# Patient Record
Sex: Female | Born: 1950 | Race: Asian
Health system: Southern US, Academic
[De-identification: ages and names within clinical notes are randomized; demographics above are authoritative.]

## PROBLEM LIST (undated history)

## (undated) ENCOUNTER — Encounter

## (undated) ENCOUNTER — Telehealth

## (undated) ENCOUNTER — Other Ambulatory Visit

## (undated) DIAGNOSIS — E119 Type 2 diabetes mellitus without complications: Secondary | ICD-10-CM

## (undated) DIAGNOSIS — I10 Essential (primary) hypertension: Secondary | ICD-10-CM

---

## 2015-04-24 ENCOUNTER — Ambulatory Visit: Payer: Self-pay | Admitting: Internal Medicine

## 2015-05-14 ENCOUNTER — Encounter: Payer: Self-pay | Admitting: Internal Medicine

## 2015-05-14 ENCOUNTER — Ambulatory Visit: Payer: Self-pay | Attending: Internal Medicine | Admitting: Internal Medicine

## 2015-05-14 VITALS — BP 109/72 | HR 71 | Temp 97.8°F | Resp 17 | Ht 67.0 in | Wt 165.0 lb

## 2015-05-14 DIAGNOSIS — M25671 Stiffness of right ankle, not elsewhere classified: Secondary | ICD-10-CM | POA: Insufficient documentation

## 2015-05-14 DIAGNOSIS — Z0000 Encounter for general adult medical examination without abnormal findings: Secondary | ICD-10-CM | POA: Insufficient documentation

## 2015-05-14 DIAGNOSIS — I10 Essential (primary) hypertension: Secondary | ICD-10-CM | POA: Insufficient documentation

## 2015-05-14 DIAGNOSIS — M25461 Effusion, right knee: Secondary | ICD-10-CM | POA: Insufficient documentation

## 2015-05-14 DIAGNOSIS — IMO0001 Reserved for inherently not codable concepts without codable children: Secondary | ICD-10-CM

## 2015-05-14 DIAGNOSIS — R42 Dizziness and giddiness: Secondary | ICD-10-CM | POA: Insufficient documentation

## 2015-05-14 DIAGNOSIS — M79671 Pain in right foot: Secondary | ICD-10-CM | POA: Insufficient documentation

## 2015-05-14 DIAGNOSIS — R51 Headache: Secondary | ICD-10-CM | POA: Insufficient documentation

## 2015-05-14 DIAGNOSIS — R030 Elevated blood-pressure reading, without diagnosis of hypertension: Secondary | ICD-10-CM | POA: Insufficient documentation

## 2015-05-14 MED ORDER — AMLODIPINE BESYLATE 5 MG PO TABS: 5.0000 mg | ORAL_TABLET | Freq: Every day | ORAL | Status: DC

## 2015-05-14 MED ORDER — CLONIDINE HCL 0.2 MG PO TABS
0.2000 mg | ORAL_TABLET | Freq: Once | ORAL | Status: AC
  Administered 2015-05-14: 0.2 mg via ORAL

## 2015-05-14 NOTE — Progress Notes (Signed)
Patient ID: Debra David, female   DOB: 15-Jun-1950, 64 y.o.   MRN: [id number]  [id number]  [id number]  DOB - 1951-05-30  CC:  Chief Complaint  Patient presents with  . Establish Care       HPI: Debra David is a 64 y.o. female here today to establish medical care. Patient reports that she has been feeling dizzy for several months with blurred vision and headaches. She has never been diagnosed with hypertension. She has not been to a doctor in several years. Denies tobacco, drug, or alcohol use.  Constant right foot pain that is described as feeling like she is walking on rocks. She also reports a growth on the heel of the right foot that is tender to touch that has been present for greater than one year. She denies injury. She suffers from right knee swelling as well.  She complains that for over one year she has had swelling in her left fifth finger and has been unable to bend the finger into a fist. The finger is not painful. She denies any family history of rheumatoid.  No Known Allergies History reviewed. No pertinent past medical history. No current outpatient prescriptions on file prior to visit.   No current facility-administered medications on file prior to visit.   History reviewed. No pertinent family history. Social History   Social History  . Marital Status: Single    Spouse Name: N/A  . Number of Children: N/A  . Years of Education: N/A   Occupational History  . Not on file.   Social History Main Topics  . Smoking status: Never Smoker   . Smokeless tobacco: Not on file  . Alcohol Use: No  . Drug Use: Not on file  . Sexual Activity: Not on file   Other Topics Concern  . Not on file   Social History Narrative  . No narrative on file    Review of Systems  Eyes: Positive for blurred vision.  Cardiovascular: Negative for chest pain and palpitations.  Musculoskeletal: Positive for joint pain. Negative for falls.  Neurological: Positive for dizziness  and headaches.  All other systems reviewed and are negative.    Objective:   Filed Vitals:   05/14/15 0954 05/14/15 0955  BP: 205/111 189/108  Pulse: 91 92  Temp: 97.8 F (36.6 C)   Resp: 17     Physical Exam  Constitutional: She is oriented to person, place, and time.  Neck: No JVD present.  Cardiovascular: Normal rate, regular rhythm and normal heart sounds.   No murmur heard. Pulmonary/Chest: Effort normal and breath sounds normal. She has no wheezes.  Musculoskeletal: She exhibits tenderness (right heel area).  Right knee swelling Left fifth finger swelling, unable to bend finger  Neurological: She is alert and oriented to person, place, and time.  Skin: Skin is warm and dry.  Psychiatric: She has a normal mood and affect.     No results found for: WBC, HGB, HCT, MCV, PLT No results found for: CREATININE, BUN, NA, K, CL, CO2  No results found for: HGBA1C Lipid Panel  No results found for: CHOL, TRIG, HDL, CHOLHDL, VLDL, LDLCALC     Assessment and plan:   Debra David was seen today for establish care.  Diagnoses and all orders for this visit:  Elevated blood pressure -     cloNIDine (CATAPRES) tablet 0.2 mg; Take 1 tablet (0.2 mg total) by mouth once in office.  Pressure improved dramatically before discharge.  Essential hypertension -  amLODipine (NORVASC) 5 MG tablet; Take 1 tablet (5 mg total) by mouth daily. I will begin her on low dose amlodipine and have her to come back in 1 week for a BP check. If she is still >140/90 then I will increase her Amlodipine to 10 mg daily. DASH diet advised  Knee swelling, right May try tylenol arthritis for now. No NSAID's due to severely elevated pressures. I will send referral to Ortho once she obtains financial assistance  Right foot pain See above  Health care maintenance -     Flu Vaccine QUAD 36+ mos PF IM (Fluarix & Fluzone Quad PF)  Return in about 1 week (around 05/21/2015) for Nurse Visit-BP check/, Lab  Visit and 3 mo PCP .    Lance Bosch, [facility] [phone] 05/14/2015, 10:26 AM

## 2015-05-14 NOTE — Progress Notes (Signed)
Patient here to establish care Patient complains of feeling dizzy at times Presents in office with elevated blood pressure 0.2mg  catapress given per office protocol Patient also complains of pain to the back of her right ankle and bottom  Of right foot

## 2015-05-15 ENCOUNTER — Other Ambulatory Visit: Payer: Self-pay

## 2015-05-15 DIAGNOSIS — Z1231 Encounter for screening mammogram for malignant neoplasm of breast: Secondary | ICD-10-CM

## 2015-05-21 ENCOUNTER — Ambulatory Visit: Payer: Self-pay

## 2015-05-22 ENCOUNTER — Ambulatory Visit: Payer: Self-pay | Attending: Internal Medicine

## 2015-06-04 ENCOUNTER — Telehealth: Payer: Self-pay | Admitting: Internal Medicine

## 2015-06-04 ENCOUNTER — Telehealth: Payer: Self-pay

## 2015-06-04 NOTE — Telephone Encounter (Signed)
Pt saying she has been feeling dizzy and is wondering if her BP medication is contributing to the dizziness. Please call pt.

## 2015-06-04 NOTE — Telephone Encounter (Signed)
Returned phone call to house number Person who answered told me i have the wrong number and hung up

## 2015-06-16 ENCOUNTER — Ambulatory Visit: Payer: Self-pay | Admitting: Internal Medicine

## 2015-06-26 ENCOUNTER — Ambulatory Visit: Payer: Self-pay | Attending: Internal Medicine | Admitting: Internal Medicine

## 2015-06-26 ENCOUNTER — Telehealth: Payer: Self-pay | Admitting: Internal Medicine

## 2015-06-26 ENCOUNTER — Encounter: Payer: Self-pay | Admitting: Internal Medicine

## 2015-06-26 VITALS — BP 128/72

## 2015-06-26 DIAGNOSIS — M170 Bilateral primary osteoarthritis of knee: Secondary | ICD-10-CM

## 2015-06-26 DIAGNOSIS — Z79899 Other long term (current) drug therapy: Secondary | ICD-10-CM | POA: Insufficient documentation

## 2015-06-26 DIAGNOSIS — M545 Low back pain, unspecified: Secondary | ICD-10-CM

## 2015-06-26 DIAGNOSIS — I951 Orthostatic hypotension: Secondary | ICD-10-CM

## 2015-06-26 DIAGNOSIS — I10 Essential (primary) hypertension: Secondary | ICD-10-CM

## 2015-06-26 DIAGNOSIS — R51 Headache: Secondary | ICD-10-CM | POA: Insufficient documentation

## 2015-06-26 DIAGNOSIS — M791 Myalgia: Secondary | ICD-10-CM | POA: Insufficient documentation

## 2015-06-26 MED ORDER — MELOXICAM 7.5 MG PO TABS: 7.5000 mg | ORAL_TABLET | Freq: Every day | ORAL | Status: AC

## 2015-06-26 MED FILL — AMLODIPINE BESYLATE 5 MG TA: 5 | 30 days supply | Qty: 30 | Fill #2

## 2015-06-26 MED FILL — MELOXICAM 7.5 MG TABLET: 7.5 | 30 days supply | Qty: 30 | Fill #0
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
## 2015-06-26 NOTE — Patient Instructions (Signed)
Start taking BP medication at bedtime to prevent dizziness. When getting up to change positions please do so slowly to prevent dizziness
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
## 2015-07-03 ENCOUNTER — Telehealth: Payer: Self-pay | Admitting: Internal Medicine
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
## 2015-07-10 ENCOUNTER — Encounter: Payer: Self-pay | Admitting: Internal Medicine

## 2015-07-15 ENCOUNTER — Telehealth: Payer: Self-pay | Admitting: Internal Medicine

## 2015-07-15 ENCOUNTER — Encounter: Payer: Self-pay | Admitting: Internal Medicine

## 2015-07-15 ENCOUNTER — Ambulatory Visit: Payer: Self-pay | Attending: Internal Medicine | Admitting: Internal Medicine

## 2015-07-15 VITALS — BP 161/98 | HR 99 | Temp 98.0°F | Resp 16 | Ht 67.0 in | Wt 157.0 lb

## 2015-07-15 DIAGNOSIS — Z780 Asymptomatic menopausal state: Secondary | ICD-10-CM | POA: Insufficient documentation

## 2015-07-15 DIAGNOSIS — M79671 Pain in right foot: Secondary | ICD-10-CM

## 2015-07-15 DIAGNOSIS — R42 Dizziness and giddiness: Secondary | ICD-10-CM | POA: Insufficient documentation

## 2015-07-15 DIAGNOSIS — Z79899 Other long term (current) drug therapy: Secondary | ICD-10-CM | POA: Insufficient documentation

## 2015-07-15 DIAGNOSIS — Z0000 Encounter for general adult medical examination without abnormal findings: Secondary | ICD-10-CM

## 2015-07-15 DIAGNOSIS — I10 Essential (primary) hypertension: Secondary | ICD-10-CM

## 2015-07-15 DIAGNOSIS — K089 Disorder of teeth and supporting structures, unspecified: Secondary | ICD-10-CM

## 2015-07-15 LAB — CBC WITH DIFFERENTIAL/PLATELET
BASOS PCT: 0 % (ref 0–1)
Basophils Absolute: 0 10*3/uL (ref 0.0–0.1)
EOS ABS: 0.1 10*3/uL (ref 0.0–0.7)
Eosinophils Relative: 1 % (ref 0–5)
HCT: 44 % (ref 36.0–46.0)
Hemoglobin: 13.9 g/dL (ref 12.0–15.0)
Lymphocytes Relative: 45 % (ref 12–46)
Lymphs Abs: 3.3 10*3/uL (ref 0.7–4.0)
MCH: 29.4 pg (ref 26.0–34.0)
MCHC: 31.6 g/dL (ref 30.0–36.0)
MCV: 93 fL (ref 78.0–100.0)
MONOS PCT: 8 % (ref 3–12)
MPV: 10.2 fL (ref 8.6–12.4)
Monocytes Absolute: 0.6 10*3/uL (ref 0.1–1.0)
NEUTROS PCT: 46 % (ref 43–77)
Neutro Abs: 3.4 10*3/uL (ref 1.7–7.7)
PLATELETS: 445 10*3/uL — AB (ref 150–400)
RBC: 4.73 MIL/uL (ref 3.87–5.11)
RDW: 13.8 % (ref 11.5–15.5)
WBC: 7.3 10*3/uL (ref 4.0–10.5)

## 2015-07-15 LAB — TSH: TSH: 1.28 m[IU]/L

## 2015-07-15 MED ORDER — LOSARTAN POTASSIUM 50 MG PO TABS: 50.0000 mg | ORAL_TABLET | Freq: Every day | ORAL | Status: DC

## 2015-07-15 MED ORDER — NYSTATIN 100000 UNIT/GM EX CREA: 1.0000 "application " | TOPICAL_CREAM | Freq: Two times a day (BID) | CUTANEOUS | Status: AC

## 2015-07-15 MED FILL — NYSTATIN 100,000 UNIT/GM CR: 100000 | 14 days supply | Qty: 30 | Fill #0

## 2015-07-15 MED FILL — LOSARTAN POTASSIUM 50 MG TA: 50 | 30 days supply | Qty: 30 | Fill #0

## 2015-07-15 NOTE — Progress Notes (Signed)
Patient ID: Debra David, female   DOB: 1951-03-11, 65 y.o.   MRN: [id number]  CC: physical  HPI: Debra David is a 65 y.o. female here today for a annual physical.  Patient has past medical history of hypertension. Patient is not up to date  On her pap or mammogram. She is only on Amlodipine and has been controlled. Patient is postmenopausal and is not a smoker. She denies alcohol or drug use. She is unemployed. She states that she would like to see a Pharmacist, community and Optometrist as part of her health maintenance. She denies vaginal discharge, itch, odor, or lesions. No new sexual partners. She would like STD testing.  Patient still complains of mild dizziness throughout the day although she has switched to taking her Amlodipine at bedtime. She is requesting a change in medication today.   No Known Allergies History reviewed. No pertinent past medical history. Current Outpatient Prescriptions on File Prior to Visit  Medication Sig Dispense Refill  . amLODipine (NORVASC) 5 MG tablet Take 1 tablet (5 mg total) by mouth daily. 90 tablet 3  . meloxicam (MOBIC) 7.5 MG tablet Take 1 tablet (7.5 mg total) by mouth daily. For knee/back pain 30 tablet 1   No current facility-administered medications on file prior to visit.   Family History  Problem Relation Age of Onset  . Cancer Father     lung  . Cancer Maternal Grandmother     breast  . Diabetes Maternal Grandmother   . Cancer Mother     breast cancer   Social History   Social History  . Marital Status: Single    Spouse Name: N/A  . Number of Children: N/A  . Years of Education: N/A   Occupational History  . Not on file.   Social History Main Topics  . Smoking status: Never Smoker   . Smokeless tobacco: Not on file  . Alcohol Use: No  . Drug Use: Not on file  . Sexual Activity: Not on file   Other Topics Concern  . Not on file   Social History Narrative    Review of Systems  Constitutional:       Dry skin  Musculoskeletal:        Right heel pain   Skin: Positive for itching.       Varicose veins-achy legs  Neurological: Positive for dizziness and headaches.  All other systems reviewed and are negative.   Objective:   Filed Vitals:   07/15/15 1444  BP: 161/98  Pulse: 99  Temp: 98 F (36.7 C)  Resp: 16    Physical Exam: Constitutional: Patient appears well-developed and well-nourished. No distress. HENT: Normocephalic, atraumatic, External right and left ear normal. Oropharynx is clear and moist.  Eyes: Conjunctivae and EOM are normal. PERRLA, no scleral icterus. Neck: Normal ROM. Neck supple. No JVD. No tracheal deviation. No thyromegaly. CVS: RRR, S1/S2 +, no murmurs, no gallops, no carotid bruit.  Pulmonary: Effort and breath sounds normal, no stridor, rhonchi, wheezes, rales.  Abdominal: Soft. BS +,  no distension, tenderness, rebound or guarding.  Musculoskeletal: Normal range of motion. No edema and no tenderness.  Lymphadenopathy: No lymphadenopathy noted, cervical, inguinal or axillary Neuro: Alert. Normal reflexes, muscle tone coordination. No cranial nerve deficit. Skin: Skin is warm and dry. No rash noted. Not diaphoretic. No erythema. No pallor. Psychiatric: Normal mood and affect. Behavior, judgment, thought content normal. Physical Exam  Pulmonary/Chest: Right breast exhibits no mass and no nipple discharge. Left breast exhibits no  mass and no nipple discharge.  Left nipple-skin growth  Genitourinary: Uterus normal. No breast tenderness. There is rash on the right labia. There is rash on the left labia. Cervix exhibits no motion tenderness, no discharge and no friability. Right adnexum displays no tenderness. Left adnexum displays no tenderness. Vaginal discharge found.  Lymphadenopathy:       Right: No inguinal adenopathy present.       Left: No inguinal adenopathy present.       No results found for: WBC, HGB, HCT, MCV, PLT No results found for: CREATININE, BUN, NA, K, CL,  CO2  No results found for: HGBA1C Lipid Panel  No results found for: CHOL, TRIG, HDL, CHOLHDL, VLDL, LDLCALC     Assessment and plan:   [name] was seen today for annual exam.  Diagnoses and all orders for this visit:  Annual physical exam -     Cytology - PAP [place] -     HIV antibody (with reflex) -     RPR -     Hepatitis panel, acute -     CBC with Differential -     TSH  Poor dentition -     Ambulatory referral to Dentistry  Heel pain, right -     Ambulatory referral to Podiatry  Essential hypertension -     losartan (COZAAR) 50 MG tablet; Take 1 tablet (50 mg total) by mouth daily. -     COMPLETE METABOLIC PANEL WITH GFR -     Lipid panel I have discontinued Amlodipine and switched her to Losartan. Hopefully this will help with dizziness. She will need to return in 2 weeks for a BP check with Stacy.  Return in about 2 weeks (around 07/29/2015) for Nurse Visit-BP check and 3 mo PCP HTN.       Lance Bosch, [facility] [phone] 07/15/2015, 2:57 PM

## 2015-07-15 NOTE — Telephone Encounter (Signed)
Patient picked up [place].

## 2015-07-15 NOTE — Progress Notes (Signed)
Patient here for her annual check up Patient presents with elevated pressure States she has been taking her medication

## 2015-07-15 NOTE — Addendum Note (Signed)
Addended by: Dorothe Pea on: 07/15/2015 04:38 PM   Modules accepted: Orders

## 2015-07-16 LAB — LIPID PANEL
Cholesterol: 291 mg/dL — ABNORMAL HIGH (ref 125–200)
HDL: 58 mg/dL (ref >=46)
LDL Cholesterol: 170 mg/dL — ABNORMAL HIGH (ref <130)
Total CHOL/HDL Ratio: 5 Ratio (ref <=5.0)
Triglycerides: 317 mg/dL — ABNORMAL HIGH (ref <150)
VLDL: 63 mg/dL — ABNORMAL HIGH (ref <30)

## 2015-07-16 LAB — COMPLETE METABOLIC PANEL WITH GFR
ALBUMIN: 4.4 g/dL (ref 3.6–5.1)
ALK PHOS: 116 U/L (ref 33–130)
ALT: 23 U/L (ref 6–29)
AST: 26 U/L (ref 10–35)
BILIRUBIN TOTAL: 0.5 mg/dL (ref 0.2–1.2)
BUN: 6 mg/dL — ABNORMAL LOW (ref 7–25)
CO2: 28 mmol/L (ref 20–31)
Calcium: 9.7 mg/dL (ref 8.6–10.4)
Chloride: 103 mmol/L (ref 98–110)
Creat: 0.83 mg/dL (ref 0.50–0.99)
GFR, EST AFRICAN AMERICAN: 87 mL/min (ref >=60)
GFR, EST NON AFRICAN AMERICAN: 76 mL/min (ref >=60)
GLUCOSE: 356 mg/dL — AB (ref 65–99)
Potassium: 5.6 mmol/L — ABNORMAL HIGH (ref 3.5–5.3)
Sodium: 141 mmol/L (ref 135–146)
TOTAL PROTEIN: 7.4 g/dL (ref 6.1–8.1)

## 2015-07-16 LAB — HEPATITIS PANEL, ACUTE
HCV Ab: NEGATIVE
HEP B S AG: NEGATIVE
Hep A IgM: NONREACTIVE
Hep B C IgM: NONREACTIVE

## 2015-07-16 LAB — HIV ANTIBODY (ROUTINE TESTING W REFLEX): HIV 1&2 Ab, 4th Generation: NONREACTIVE

## 2015-07-16 LAB — CERVICOVAGINAL ANCILLARY ONLY
CHLAMYDIA, DNA PROBE: NEGATIVE
NEISSERIA GONORRHEA: NEGATIVE

## 2015-07-16 LAB — RPR

## 2015-07-17 ENCOUNTER — Telehealth: Payer: Self-pay | Admitting: Internal Medicine

## 2015-07-17 ENCOUNTER — Telehealth: Payer: Self-pay

## 2015-07-17 LAB — CYTOLOGY - PAP

## 2015-07-17 LAB — CERVICOVAGINAL ANCILLARY ONLY: Wet Prep (BD Affirm): POSITIVE — AB

## 2015-07-17 NOTE — Telephone Encounter (Signed)
Patient states she has lost her mammogram  order form, please f/u

## 2015-07-17 NOTE — Telephone Encounter (Signed)
Returned patient phone call Patient had lost the information for her mammogram Information left at front desk

## 2015-07-21 ENCOUNTER — Telehealth: Payer: Self-pay

## 2015-07-21 ENCOUNTER — Other Ambulatory Visit: Payer: Self-pay | Admitting: Internal Medicine

## 2015-07-21 DIAGNOSIS — B9689 Other specified bacterial agents as the cause of diseases classified elsewhere: Secondary | ICD-10-CM

## 2015-07-21 DIAGNOSIS — E7800 Pure hypercholesterolemia, unspecified: Secondary | ICD-10-CM

## 2015-07-21 DIAGNOSIS — E875 Hyperkalemia: Secondary | ICD-10-CM

## 2015-07-21 DIAGNOSIS — N760 Acute vaginitis: Secondary | ICD-10-CM

## 2015-07-21 MED ORDER — FUROSEMIDE 20 MG PO TABS
20.0000 mg | ORAL_TABLET | Freq: Every day | ORAL | Status: AC
Start: 2015-07-21 — End: ?

## 2015-07-21 MED ORDER — ATORVASTATIN CALCIUM 20 MG PO TABS: 20.0000 mg | ORAL_TABLET | Freq: Every day | ORAL | Status: AC

## 2015-07-21 MED ORDER — METRONIDAZOLE 500 MG PO TABS: 500.0000 mg | ORAL_TABLET | Freq: Two times a day (BID) | ORAL | Status: AC

## 2015-07-21 MED FILL — ATORVASTATIN 20 MG TABLET: 20 | 30 days supply | Qty: 30 | Fill #0

## 2015-07-21 MED FILL — ?FUROSEMIDE 20 MG TABLET: 20 | 10 days supply | Qty: 10 | Fill #0

## 2015-07-21 MED FILL — metroNIDAZOLE 500 MG TABS: 500 | 7 days supply | Qty: 14 | Fill #0

## 2015-07-21 NOTE — Telephone Encounter (Signed)
-----   Message from Lance Bosch, NP sent at 07/21/2015  9:07 AM EST ----- Patient positive for Bacterial Vaginosis . Please explain this is not a STD, but a imbalance of the vaginal pH. Please send Flagyl 500 mg BID for 7 days. No refills, no alcohol while on this medication.  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 Cholesterol is really elevated. Please go over things that increase cholesterol levels such as breads pasta, rice, butters, fried foods, etc. Please send Lipitor 20 mg to take every evening with dinner. Please explain that high cholesterol places her at risk for stroke and heart disease Potassium is slightly elevated I will send her few days of Lasix which will make her urinate out potassium. Please explain importance of normal potassium level

## 2015-07-21 NOTE — Telephone Encounter (Signed)
Spoke with patient and she is aware of her lab results Medications for flagyl and [facility] Patient is scheduled for the colpol clinic for March 2nd at [facility]

## 2015-07-23 ENCOUNTER — Other Ambulatory Visit: Payer: Self-pay

## 2015-07-23 ENCOUNTER — Other Ambulatory Visit: Payer: Self-pay | Admitting: Internal Medicine

## 2015-07-23 DIAGNOSIS — Z1231 Encounter for screening mammogram for malignant neoplasm of breast: Secondary | ICD-10-CM

## 2015-07-30 ENCOUNTER — Ambulatory Visit: Payer: Self-pay | Attending: Internal Medicine | Admitting: Pharmacist

## 2015-07-30 ENCOUNTER — Encounter: Payer: Self-pay | Admitting: Pharmacist

## 2015-07-30 VITALS — BP 136/88 | HR 92

## 2015-07-30 DIAGNOSIS — I10 Essential (primary) hypertension: Secondary | ICD-10-CM

## 2015-07-30 NOTE — Patient Instructions (Addendum)
Thanks for coming to see me!  Your blood pressure looks much better today. Continue taking the losartan.  Follow up with Mateo Flow on the dizziness

## 2015-07-30 NOTE — Progress Notes (Signed)
S:    Patient arrives in good spirits.  Presents to the clinic for hypertension evaluation. Patient was referred on 07/15/15 by Chari Manning.  Patient was last seen by Primary Care Provider on 07/15/15.   Patient reports adherence with medications.  Current BP Medications include:  Losartan 50 mg daily.  Dietary habits include: patient eats salty foods, such as chips and crackers, as well as some fast food.  Patient denies picking up the furosemide from the pharmacy - she reports that she did not know that it was there.   Patient reports continued dizziness and thinks that it may be related to a migraine. She reports that she has been told in the past that she has migraines. She reports dizziness when standing but not while sitting. She takes care to get up slowly. The dizziness comes and goes and does not occur every time she stands or walks.    O:   Last 3 Office BP readings: BP Readings from Last 3 Encounters:  07/30/15 136/88  07/15/15 161/98  06/26/15 128/72    BMET    Component Value Date/Time   NA 141 07/15/2015 1536   K 5.6* 07/15/2015 1536   CL 103 07/15/2015 1536   CO2 28 07/15/2015 1536   GLUCOSE 356* 07/15/2015 1536   BUN 6* 07/15/2015 1536   CREATININE 0.83 07/15/2015 1536   CALCIUM 9.7 07/15/2015 1536   GFRNONAA 76 07/15/2015 1536   GFRAA 87 07/15/2015 1536    A/P: Hypertension currently controlled on current medications.  Continued losartan 50 mg daily. Instructed patient to pick up furosemide and to take it to get her potassium down. Reinforced the dangers of elevated potassium and the importance of taking furosemide. Patient verbalized understanding. I am not sure if the dizziness is orthostatic hypotension or related to a migraine. Her blood pressure is controlled now and she refuses to try another medication since this one worked so well for her. Reviewed DASH diet with patient. Patient to follow up with Mateo Flow for continued dizziness and  hyperkalemia.  Results reviewed and written information provided.   Total time in face-to-face counseling 30 minutes.   F/U Clinic Visit with Chari Manning. Patient seen with Dr. Janne Napoleon.

## 2015-08-01 ENCOUNTER — Ambulatory Visit
Admission: RE | Admit: 2015-08-01 | Discharge: 2015-08-01 | Disposition: A | Discharge status: Home / Self Care | Payer: No Typology Code available for payment source | Source: Ambulatory Visit | Attending: Internal Medicine | Admitting: Internal Medicine

## 2015-08-01 DIAGNOSIS — Z1231 Encounter for screening mammogram for malignant neoplasm of breast: Secondary | ICD-10-CM

## 2015-08-05 ENCOUNTER — Other Ambulatory Visit: Payer: Self-pay | Admitting: Internal Medicine

## 2015-08-05 DIAGNOSIS — R928 Other abnormal and inconclusive findings on diagnostic imaging of breast: Secondary | ICD-10-CM

## 2015-08-06 ENCOUNTER — Ambulatory Visit: Payer: Self-pay | Attending: Internal Medicine | Admitting: Internal Medicine

## 2015-08-06 ENCOUNTER — Encounter: Payer: Self-pay | Admitting: Internal Medicine

## 2015-08-06 VITALS — BP 123/85 | HR 98 | Temp 98.2°F | Resp 18 | Ht 67.0 in | Wt 153.2 lb

## 2015-08-06 DIAGNOSIS — R42 Dizziness and giddiness: Secondary | ICD-10-CM

## 2015-08-06 DIAGNOSIS — S66902D Unspecified injury of unspecified muscle, fascia and tendon at wrist and hand level, left hand, subsequent encounter: Secondary | ICD-10-CM

## 2015-08-06 DIAGNOSIS — M170 Bilateral primary osteoarthritis of knee: Secondary | ICD-10-CM

## 2015-08-06 DIAGNOSIS — S66802D Unspecified injury of other specified muscles, fascia and tendons at wrist and hand level, left hand, subsequent encounter: Secondary | ICD-10-CM

## 2015-08-06 MED FILL — LOSARTAN POTASSIUM 50 MG TA: 50 | 30 days supply | Qty: 30 | Fill #1

## 2015-08-06 NOTE — Progress Notes (Signed)
   Subjective:    Patient ID: Debra David, female    DOB: 1951-04-24, 65 y.o.   MRN: [id number]  HPI Comments: Patient reported dizziness after being started on Amlodipine. She was told to take medication at bedtime to prevent dizziness. She continued to report dizziness so she was later switched to Losartan. She now reports that she is still having dizziness and headaches.  Patient reports that she now has [employer] discount and would like her referral to Orthopedics for knee osteoarthritis and left finger that she has been unable to bend for over one year.  Dizziness This is a recurrent problem. The current episode started more than 1 month ago. The problem occurs daily. The problem has been unchanged. Associated symptoms include arthralgias, headaches and joint swelling. Pertinent negatives include no abdominal pain, fatigue, nausea or vertigo. The symptoms are aggravated by standing (changing positions quickly). She has tried nothing for the symptoms.    Review of Systems  Constitutional: Negative for fatigue.  Gastrointestinal: Negative for nausea and abdominal pain.  Musculoskeletal: Positive for joint swelling and arthralgias.  Neurological: Positive for dizziness and headaches. Negative for vertigo.  All other systems reviewed and are negative.      Objective:   Physical Exam  Constitutional: She is oriented to person, place, and time.  Cardiovascular: Normal rate and normal heart sounds.   Pulmonary/Chest: Effort normal and breath sounds normal.  Musculoskeletal: She exhibits tenderness.  Unable to bend left 5th digit Severe crepitus of right knee with ROM  Neurological: She is alert and oriented to person, place, and time. No cranial nerve deficit. Coordination normal.  Skin: Skin is warm and dry.  Psychiatric: She has a normal mood and affect.      Assessment & Plan:  Treanna was seen today for dizziness.  Diagnoses and all orders for this visit:  Dizziness Continue to  take BP medication at bedtime. Orthostatic vitals are unrevealing. I believe patient is having some positional vertigo. I have stressed that she change positions slowly and stay hydrated. She reports only drinking 2 bottles of water per day.   Injury of flexor tendon of hand, left, subsequent encounter -     Ambulatory referral to Orthopedic Surgery  Osteoarthritis of both knees, unspecified osteoarthritis type -     Ambulatory referral to Orthopedic Surgery   Return in about 3 months (around 11/06/2015) for Hypertension.  Lance Bosch, NP 08/07/2015 9:21 AM

## 2015-08-06 NOTE — Progress Notes (Signed)
Patient here for dizziness that has been occuring a while now. Patient reports when she first experienced it her medication was changed but she is still experiencing it. Patient reports headaches that occur every day that have been present for a long time.

## 2015-08-06 NOTE — Patient Instructions (Signed)

## 2015-08-07 ENCOUNTER — Ambulatory Visit (INDEPENDENT_AMBULATORY_CARE_PROVIDER_SITE_OTHER): Payer: Self-pay | Admitting: Family Medicine

## 2015-08-07 VITALS — BP 147/89 | HR 101 | Temp 98.6°F | Ht 67.0 in | Wt 158.4 lb

## 2015-08-07 DIAGNOSIS — R87810 Cervical high risk human papillomavirus (HPV) DNA test positive: Secondary | ICD-10-CM | POA: Insufficient documentation

## 2015-08-07 NOTE — Patient Instructions (Signed)

## 2015-08-07 NOTE — Progress Notes (Signed)
Patient ID: Debra David, female   DOB: 1950-06-23, 65 y.o.   MRN: [id number]  Chief Complaint  Patient presents with  . Colposcopy    HPI Debra David is a 65 y.o. female.  Denies any current complaints. Post-menopausal.  HPI  Indications: Pap smear on February 2017 showed: normal cytology with high risk HPV positive. Previous colposcopy: none. Prior cervical treatment: no treatment.  No past medical history on file.  No past surgical history on file.  Family History  Problem Relation Age of Onset  . Cancer Father     lung  . Cancer Maternal Grandmother     breast  . Diabetes Maternal Grandmother   . Cancer Mother     breast cancer    Social History Social History  Substance Use Topics  . Smoking status: Never Smoker   . Smokeless tobacco: None  . Alcohol Use: No    No Known Allergies  Current Outpatient Prescriptions  Medication Sig Dispense Refill  . atorvastatin (LIPITOR) 20 MG tablet Take 1 tablet (20 mg total) by mouth daily. 90 tablet 3  . furosemide (LASIX) 20 MG tablet Take 1 tablet (20 mg total) by mouth daily. 10 tablet 0  . losartan (COZAAR) 50 MG tablet Take 1 tablet (50 mg total) by mouth daily. 30 tablet 3  . meloxicam (MOBIC) 7.5 MG tablet Take 1 tablet (7.5 mg total) by mouth daily. For knee/back pain (Patient not taking: Reported on 08/06/2015) 30 tablet 1  . metroNIDAZOLE (FLAGYL) 500 MG tablet Take 1 tablet (500 mg total) by mouth 2 (two) times daily. (Patient not taking: Reported on 08/06/2015) 14 tablet 0  . nystatin cream (MYCOSTATIN) Apply 1 application topically 2 (two) times daily. Apply to outside of the vaginal area (Patient not taking: Reported on 08/06/2015) 30 g 0   No current facility-administered medications for this visit.    Review of Systems Review of Systems - no complaints  Blood pressure 147/89, pulse 101, temperature 98.6 F (37 C), temperature source Oral, height 5\' 7"  (1.702 m), weight 158 lb 6.4 oz (71.85 kg).  Physical  Exam Physical Exam  Genitourinary: Vagina normal. There is no rash, lesion or injury on the right labia. There is no rash, lesion or injury on the left labia. Cervix exhibits no friability. Discharge: mild white thin discharge present.  No cervical lesions or areas of increased uptake of solution    Data Reviewed Pap smear reviewed  Assessment    Procedure Details  The risks and benefits of the procedure and Written informed consent obtained.  Speculum placed in vagina and excellent visualization of cervix achieved, cervix swabbed x 3 with acetic acid solution and then with iodine solution. Cervix was visualized through colposcope.  Uniform uptake of acetic acid and iodine over cervix.  No areas of abnormality.  No specimens collected.  Specimens: none  Complications: none.     Plan   Normal colposcopy, no specimens collected Repeat pap smear in 1 year       Virginia Crews, MD, MPH PGY-2,  [facility] 08/07/2015 11:04 AM

## 2015-08-13 ENCOUNTER — Encounter: Payer: Self-pay | Admitting: Podiatry

## 2015-08-13 ENCOUNTER — Ambulatory Visit: Payer: No Typology Code available for payment source

## 2015-08-13 ENCOUNTER — Ambulatory Visit: Payer: No Typology Code available for payment source | Admitting: Podiatry

## 2015-08-13 VITALS — BP 159/86 | HR 82 | Resp 12

## 2015-08-13 DIAGNOSIS — S86011A Strain of right Achilles tendon, initial encounter: Secondary | ICD-10-CM

## 2015-08-13 DIAGNOSIS — D3613 Benign neoplasm of peripheral nerves and autonomic nervous system of lower limb, including hip: Secondary | ICD-10-CM

## 2015-08-13 DIAGNOSIS — R52 Pain, unspecified: Secondary | ICD-10-CM

## 2015-08-13 DIAGNOSIS — G5761 Lesion of plantar nerve, right lower limb: Secondary | ICD-10-CM

## 2015-08-13 NOTE — Progress Notes (Signed)
   Subjective:    Patient ID: Debra David, female    DOB: July 25, 1950, 65 y.o.   MRN: [id number]  HPI   This patient presents today describing intermittent sharp shooting pains in around her ball area of her right and left feet, with the right foot more symptomatic than the left she describes sharp shooting-like pains and a feeling as if rocks are in the ball area of the feet that occur on and off weightbearing for the past 2 years increasing with frequency in the past 6 months. She has changed her shoes and reduced amount of standing walking to accommodate disease uncomfortable sensations.  She is also complaining of pain in the tendo Achilles area for the past 2 years with gradual increasing pain and swelling in the right tendo Achilles. Patient denies any direct injury to the right tendo Achilles. In the past patient has had weightbearing occupation working in [employer], however, is currently retired.  Review of Systems  Musculoskeletal: Positive for joint swelling.       Objective:   Physical Exam  Orientated 3  Vascular: No peripheral edema bilaterally DP and PT pulses 2/4 bilaterally Capillary reflex immediate bilaterally  Neurological: Sensation to 10 g monofilament wire intact 4/5 right 5/5 left Vibratory sensation equal and reactive bilaterally Ankle reflex equal and reactive bilaterally  Dermatological: Texture and turgor within normal limits No open skin lesions bilaterally   Musculoskeletal: Patient has limping gait favoring the right foot Palpable thickening tendo Achilles right which is tender direct palpation and duplicates pain and tendo Achilles area right There is no pain or crepitus on range of motion of ankle, subtalar, midtarsal, metatarsophalangeal joints bilaterally Palpable tenderness in the second, third, fourth intermetatarsal spaces right greater than left without any palpable lesions.  X-ray examination weightbearing right foot dated  08/13/2015  Intact bony structure without fracture and/or dislocation Bunion Hammertoe second Pes planus Bone density joint spaces adequate  Radiographic impression: No acute bony abnormality noted in the right foot dated 08/13/2015  X-ray examination weightbearing left foot dated 08/13/2015  Intact bony structures without fracture and/or dislocation HAV Hammertoe second Pes planus Bone density and joint spaces adequate  Radiographic impression: No acute bony abnormality noted left foot dated 08/13/2015    Assessment & Plan:   Assessment: Satisfactory neurovascular status Chronic partial tear/Achilles tendinitis right Intermetatarsal neuroma, 2, 3, 4 bilateral right greater than left versus intermetatarsal neuritis 2, 3, 4 bilateral  Plan: Today I reviewed the results with the patient's examination and x-ray. I'm recommending local dexamethasone injections into the second, third, fourth right intermetatarsal spaces on the right foot which is the most symptomatic. Patient verbally consents Also, I am recommending Cam Walker boot to wear on right lower leg and treatment of Achilles tendinitis partial tear  Skin is prepped with alcohol and Betadine and in divided doses 4 mg of dexamethasone phosphate mixed with 10 mg of plain Marcaine were injected into the second, third, fourth right intermetatarsal spaces. Patient tolerated these 3 injections without any difficulty  Cam Walker boot dispensed to wear and right lower extremity when standing and walking  Reevaluate 4 weeks

## 2015-08-13 NOTE — Patient Instructions (Signed)
Today your examination revealed a swollen right heel cord consistent most likely with a partial tear for the past 2 years And placing you on a Cam Walker boot which is a removable cast and wear during the day when standing walking remove at night when showering sleeping  Also, I injected medicine imto theright foot to see if the pain in the right ball area will reduce as result of these injections

## 2015-08-14 DIAGNOSIS — D3613 Benign neoplasm of peripheral nerves and autonomic nervous system of lower limb, including hip: Secondary | ICD-10-CM

## 2015-08-14 MED ORDER — DEXAMETHASONE SODIUM PHOSPHATE 120 MG/30ML IJ SOLN
4.0000 mg | Freq: Once | INTRAMUSCULAR | Status: AC
  Administered 2015-08-14: 4 mg via INTRA_ARTICULAR

## 2015-08-19 ENCOUNTER — Other Ambulatory Visit (HOSPITAL_COMMUNITY): Payer: Self-pay | Admitting: *Deleted

## 2015-08-19 DIAGNOSIS — R928 Other abnormal and inconclusive findings on diagnostic imaging of breast: Secondary | ICD-10-CM

## 2015-08-22 ENCOUNTER — Ambulatory Visit
Admission: RE | Admit: 2015-08-22 | Discharge: 2015-08-22 | Disposition: A | Discharge status: Home / Self Care | Payer: No Typology Code available for payment source | Source: Ambulatory Visit | Attending: Obstetrics and Gynecology | Admitting: Obstetrics and Gynecology

## 2015-08-22 ENCOUNTER — Encounter (HOSPITAL_COMMUNITY): Payer: Self-pay

## 2015-08-22 ENCOUNTER — Ambulatory Visit (HOSPITAL_COMMUNITY)
Admission: RE | Admit: 2015-08-22 | Discharge: 2015-08-22 | Disposition: A | Discharge status: Home / Self Care | Payer: No Typology Code available for payment source | Source: Ambulatory Visit | Attending: Obstetrics and Gynecology | Admitting: Obstetrics and Gynecology

## 2015-08-22 VITALS — BP 120/76 | Temp 97.6°F | Ht 67.0 in | Wt 154.0 lb

## 2015-08-22 DIAGNOSIS — Z1239 Encounter for other screening for malignant neoplasm of breast: Secondary | ICD-10-CM

## 2015-08-22 DIAGNOSIS — R928 Other abnormal and inconclusive findings on diagnostic imaging of breast: Secondary | ICD-10-CM

## 2015-08-22 HISTORY — DX: Essential (primary) hypertension: I10

## 2015-08-22 MED FILL — ATORVASTATIN 20 MG TABLET: 20 | 30 days supply | Qty: 30 | Fill #1

## 2015-08-22 NOTE — Progress Notes (Signed)
Complaints of bilateral breast pain that comes and goes. Patient rated pain at 5 out of 10.  Pap Smear:  Pap smear not completed today. Last Pap smear was 07/15/2015 at [facility] that was normal with positive HPV. A colposcopy was completed for follow up 08/07/2015 that was normal. Last Pap smear and colposcopy result are in EPIC.  Physical exam: Breasts Breasts symmetrical. No skin abnormalities right breast. Growth observed on left nipple that per patient has been there since birth. No nipple retraction bilateral breasts. No nipple discharge bilateral breasts. No lymphadenopathy. No lumps palpated bilateral breasts. No complaints of pain or tenderness on exam. Referred patient to the [facility] for bilateral diagnostic mammogram per recommendation. Appointment scheduled for Friday, August 22, 2015 at 1250.   Pelvic/Bimanual No Pap smear completed today since last Pap smear was 07/15/2015. Pap smear not indicated per BCCCP guidelines.   Smoking History: Patient has never smoked.  Patient Navigation: Patient education provided. Access to services provided for patient through [facility] program.   Colorectal Cancer Screening: Patient has never had a colonoscopy. No complaints today.

## 2015-08-22 NOTE — Patient Instructions (Signed)
Educational materials on self breast awareness given. Explained to Debra David that she did not need a Pap smear today due to last Pap smear and HPV typing was 07/15/2015. Let her know BCCCP will cover Pap smears and HPV typing every 5 years unless has a history of abnormal Pap smears. Referred patient to the [facility] for bilateral diagnostic mammogram per recommendation. Appointment scheduled for Friday, August 22, 2015 at 1250. Patient aware of appointment and will be there. [name] verbalized understanding.  Tedrick Port, Arvil Chaco, RN 1:36 PM

## 2015-08-25 ENCOUNTER — Telehealth: Payer: Self-pay | Admitting: *Deleted

## 2015-08-25 NOTE — Telephone Encounter (Signed)
Pt states the boot Dr. Amalia Hailey put her in is too tight and hurts she would like to make an appt to have the boot refitted.  Transferred to schedulers to be put on the nurses schedule.

## 2015-08-27 ENCOUNTER — Telehealth: Payer: Self-pay

## 2015-08-27 ENCOUNTER — Telehealth: Payer: Self-pay | Admitting: Internal Medicine

## 2015-08-27 ENCOUNTER — Other Ambulatory Visit: Payer: No Typology Code available for payment source

## 2015-08-27 DIAGNOSIS — Z0000 Encounter for general adult medical examination without abnormal findings: Secondary | ICD-10-CM

## 2015-08-27 NOTE — Telephone Encounter (Signed)
Returned phone call to patient Patient is requesting a referral to the eye dr. Referral placed in epic

## 2015-08-27 NOTE — Telephone Encounter (Signed)
Pt. Came in today requesting a referral to see the Optometrists. Please f/u with pt.

## 2015-08-29 ENCOUNTER — Encounter: Payer: Self-pay | Admitting: Family Medicine

## 2015-08-29 ENCOUNTER — Ambulatory Visit (INDEPENDENT_AMBULATORY_CARE_PROVIDER_SITE_OTHER): Payer: Self-pay | Admitting: Family Medicine

## 2015-08-29 VITALS — BP 146/83 | Ht 67.0 in | Wt 154.0 lb

## 2015-08-29 DIAGNOSIS — M19042 Primary osteoarthritis, left hand: Secondary | ICD-10-CM

## 2015-08-29 DIAGNOSIS — M79642 Pain in left hand: Secondary | ICD-10-CM

## 2015-08-29 DIAGNOSIS — M6530 Trigger finger, unspecified finger: Secondary | ICD-10-CM

## 2015-08-29 MED ORDER — METHYLPREDNISOLONE ACETATE 40 MG/ML IJ SUSP
40.0000 mg | Freq: Once | INTRAMUSCULAR | Status: AC
  Administered 2015-08-29: 40 mg via INTRA_ARTICULAR

## 2015-08-29 NOTE — Assessment & Plan Note (Signed)
Corticosteroid injection into MCP joint. Follow-up when necessary.

## 2015-08-29 NOTE — Assessment & Plan Note (Signed)
Corticosteroid injection today into the A1 pulley area. She's not having some improvement and range of motion in the next 2-3 weeks at lights see her back in clinic. Ultrasound did show an area of potential stricture on the flexor tendon and quite a bit of synovial thickening at the A1 pulley.

## 2015-08-29 NOTE — Progress Notes (Signed)
Patient ID: Debra David, female   DOB: [birth year], 65 y.o.   MRN: [id number]  Debra David - 65 y.o. female MRN [id number]  Date of birth: [birth year]    SUBJECTIVE:     Chief Complaint: Left pinky finger pain and difficulty with flexion, also having pain in left index finger knuckle.  HPI: She has difficulties flexing the left pinky finger for many months now. Started as some stiffness and catching. In the last couple of months she can really not banded at all. She ends up catching on things and hitting accidentally. She is right-hand dominant. #2. Also on the left hand the index finger knuckle aches much of the time. He does not swell, she's not noted any redness. She's never had any injury of this note will that she's aware of. Aching is 4-6 out of 10 at times. Nothing seems to make it better. ROS:     No unusual weight change, no fever, sweats or chills. No unusual rash.  PERTINENT  PMH / PSH FH / / SH:  Past Medical, Surgical, Social, and Family History Reviewed & Updated in the EMR.  Pertinent findings include:  Hypertension, history of osteoarthritis of knees, history of abnormal mammogram with finding consistent with intra-mammary lymph node. Last ultrasound 08/22/2015. She has no personal history of diabetes mellitus. Family history of lung cancer in her father and breast cancer in her mother.    OBJECTIVE: BP 146/83 mmHg  Ht 5\' 7"  (1.702 m)  Wt 154 lb (69.854 kg)  BMI 24.11 kg/m2  Physical Exam:  Vital signs are reviewed. GEN.: Well-developed female no acute distress HANDS: Bilaterally symmetrical. LEFT HAND: The pinky finger does have some flexion passively but she resisted because it is somewhat painful. There some nodularity noted in the palmar aspect over the A1 pulley area. The index MCP is not erythematous, mildly tender to palpation at the joint line on the radial side. She has full range of motion and normal strength in that finger. ULTRASOUND: The left pinky finger has  increased synovial tissue with some tendon restriction at the MCP joint. The tendon appears otherwise healthy. The index MCP shows decreased joint space, osteophytes, very small amount of fluid.  INJECTION: Patient was given informed consent, signed copy in the chart. Appropriate time out was taken. Area prepped and draped in usual sterile fashion. One half cc of methylprednisolone 40 mg/ml plus  one half cc of 1% lidocaine without epinephrine was injected into the A1 pulley area of the left fifth finger using a(n) fall or and ultrasound-guided approach. The patient tolerated the procedure well. There were no complications. Post procedure instructions were given.  INJECTION: Patient was given informed consent, signed copy in the chart. Appropriate time out was taken. Area prepped and draped in usual sterile fashion. One half cc of methylprednisolone 40 mg/ml plus  one half cc of 1% lidocaine without epinephrine was injected into the left index MCP using a(n) medial approach. The patient tolerated the procedure well. There were no complications. Post procedure instructions were given.   ASSESSMENT & PLAN:  See problem based charting & AVS for pt instructions.

## 2015-09-03 ENCOUNTER — Encounter (HOSPITAL_COMMUNITY): Payer: Self-pay | Admitting: *Deleted

## 2015-09-05 ENCOUNTER — Ambulatory Visit: Payer: Self-pay | Attending: Internal Medicine

## 2015-09-10 ENCOUNTER — Ambulatory Visit: Payer: No Typology Code available for payment source | Admitting: Podiatry

## 2015-09-10 MED FILL — LOSARTAN POTASSIUM 50 MG TA: 50 | 30 days supply | Qty: 30 | Fill #2

## 2015-09-23 ENCOUNTER — Ambulatory Visit: Payer: No Typology Code available for payment source | Admitting: Podiatry

## 2015-09-23 ENCOUNTER — Encounter: Payer: Self-pay | Admitting: Podiatry

## 2015-09-23 VITALS — BP 128/76 | HR 90 | Resp 12

## 2015-09-23 DIAGNOSIS — S86011D Strain of right Achilles tendon, subsequent encounter: Secondary | ICD-10-CM

## 2015-09-23 NOTE — Patient Instructions (Signed)
Continue to wear the boot when standing and walking an additional 4 weeks to treat the right heel cord partial tear or swelling. The ongoing tingling may be a sign of neuropathy or nervelike problem of undetermined cause. I want to see if you continue wearing the boot if the tingling burning in uncomfortable feelings subside reduce

## 2015-09-23 NOTE — Progress Notes (Signed)
   Subjective:    Patient ID: Debra David, female    DOB: 1950-08-14, 65 y.o.   MRN: [id number]  HPI   This patient presents today describing intermittent sharp shooting pains in around her ball area of her right and left feet, with the right foot more symptomatic than the left she describes sharp shooting-like pains and a feeling as if rocks are in the ball area of the feet that occur on and off weightbearing for the past 2 years increasing with frequency in the past 6 months. She has changed her shoes and reduced amount of standing walking to accommodate disease uncomfortable sensations.  She is also complaining of pain in the tendo Achilles area for the past 2 years with gradual increasing pain and swelling in the right tendo Achilles. Patient denies any direct injury to the right tendo Achilles. In the past patient has had weightbearing occupation working in [employer], however, is currently retired.  She states that the dexamethasone injection given into the right fit on the visit of 08/13/2015 did not stopping ongoing stinging and discomfort in the balls of right and left feet. She states that the right heel cord feels considerably better when wearing the boot  Review of Systems  Musculoskeletal: Positive for gait problem.       Objective:   Physical Exam  Orientated 3  Vascular: No peripheral edema bilaterally DP and PT pulses 2/4 bilaterally Capillary reflex immediate bilaterally  Neurological: Sensation to 10 g monofilament wire intact 4/5 right 5/5 left Vibratory sensation equal and reactive bilaterally Ankle reflex equal and reactive bilaterally  Dermatological: Texture and turgor within normal limits No open skin lesions bilaterally   Musculoskeletal: Patient has limping gait favoring the right foot Palpable thickening tendo Achilles right which is tender direct palpation and duplicates pain and tendo Achilles area right There is no pain or crepitus on range of  motion of ankle, subtalar, midtarsal, metatarsophalangeal joints bilaterally Palpable tenderness in the second, third, fourth intermetatarsal spaces right greater than left without any palpable lesions.  X-ray examination weightbearing right foot dated 08/13/2015  Intact bony structure without fracture and/or dislocation Bunion Hammertoe second Pes planus Bone density joint spaces adequate  Radiographic impression: No acute bony abnormality noted in the right foot dated 08/13/2015  X-ray examination weightbearing left foot dated 08/13/2015  Intact bony structures without fracture and/or dislocation HAV Hammertoe second Pes planus Bone density and joint spaces adequate  Radiographic impression: No acute bony abnormality noted left foot dated 08/13/2015    Assessment & Plan:   Assessment: Satisfactory neurovascular status Chronic partial tear/Achilles tendinitis right Intermetatarsal neuroma, 2, 3, 4 bilateral right greater than left versus intermetatarsal neuritis 2, 3, 4 bilateral 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

## 2015-09-24 MED FILL — LOSARTAN POTASSIUM 50 MG TA: 50 | 30 days supply | Qty: 30 | Fill #3

## 2015-09-24 MED FILL — ?ATORVASTATIN 20 MG TABLET: 20 | 30 days supply | Qty: 30 | Fill #2

## 2015-10-22 ENCOUNTER — Encounter: Payer: Self-pay | Admitting: Podiatry

## 2015-10-22 ENCOUNTER — Ambulatory Visit: Payer: No Typology Code available for payment source | Admitting: Podiatry

## 2015-10-22 VITALS — BP 126/80 | HR 90 | Resp 12

## 2015-10-22 DIAGNOSIS — S86011D Strain of right Achilles tendon, subsequent encounter: Secondary | ICD-10-CM

## 2015-10-22 NOTE — Patient Instructions (Signed)
The right heel cord is still swollen and tender so please continue to wear the boot on the right leg when you stand and walk The occasional sharp shooting pains in your right and left feet on standing have a nervelike origin undetermined. At your next physical confirm that your sugar levels are adequate

## 2015-10-22 NOTE — Progress Notes (Signed)
   Subjective:    Patient ID: Debra David, female    DOB: [birth year], 65 y.o.   MRN: [id number]  HPI  This patient presents today describing intermittent sharp shooting pains in around her ball area of her right and left feet, with the right foot more symptomatic than the left she describes sharp shooting-like pains and a feeling as if rocks are in the ball area of the feet that occur on and off weightbearing for the past 2 years increasing with frequency in the past 6 months. She has changed her shoes and reduced amount of standing walking to accommodate disease uncomfortable sensations.  She is also complaining of pain in the tendo Achilles area for the past 2 years with gradual increasing pain and swelling in the right tendo Achilles. Patient denies any direct injury to the right tendo Achilles. In the past patient has had weightbearing occupation working in [employer], however, is currently retired.Patient currently wearing blue on right lower extremity on a continuous basis since the visit of 08/13/2015 and states that the right heel cord is tender however improving since wearing the boot  She states that the dexamethasone injection given into the right fit on the visit of 08/13/2015 did not stopping ongoing stinging and discomfort in the balls of right and left feet. She states that the right heel cord feels considerably better when wearing the boot. She still continues to complain of intermittent sharp shooting pains on the right and left feet on and off weightbearing with episodes lasting approximately 30 seconds up to 6 times a day resolving spontaneously   Review of Systems  Musculoskeletal: Positive for gait problem.       Objective:   Physical Exam Orientated 3  Vascular: No peripheral edema bilaterally DP and PT pulses 2/4 bilaterally Capillary reflex immediate bilaterally  Neurological: Sensation to 10 g monofilament wire intact 4/5 right 5/5 left Vibratory sensation equal  and reactive bilaterally Ankle reflex equal and reactive bilaterally  Dermatological: Texture and turgor within normal limits No open skin lesions bilaterally   Musculoskeletal:  Palpable tenderness second,, third, fourth intermetatarsal spaces bilaterally without any palpable lesions Palpable thickening tendo Achilles right which is tender direct palpation and duplicates pain and tendo Achilles area right There is no pain or crepitus on range of motion of ankle, subtalar, midtarsal, metatarsophalangeal joints bilaterally Palpable tenderness in the second, third, fourth intermetatarsal spaces right greater than left without any palpable lesions. Plantar flexion 5/5 bilaterally       Assessment & Plan:   Assessment: Achilles tendinopathy right reducing in symptoms Metatarsalgia/neuropathy bilaterally  Plan: At this time because patient still having tenderness in the tendo Achilles will have patient maintain boot on right lower extremity an additional 4 weeks for a total of 12 weeks I described the sharp intermittent pains right and left feet is neuropathic in origin and encouraged patient to confirm that her blood glucose is adequate at next scheduled visit with family physician  Reappoint 4 weeks

## 2015-10-28 ENCOUNTER — Encounter: Payer: Self-pay | Admitting: Physician Assistant

## 2015-10-28 ENCOUNTER — Ambulatory Visit: Payer: Self-pay | Attending: Internal Medicine | Admitting: Physician Assistant

## 2015-10-28 VITALS — BP 120/80 | HR 95 | Temp 98.0°F | Resp 12 | Ht 67.0 in | Wt 156.0 lb

## 2015-10-28 DIAGNOSIS — R519 Headache, unspecified: Secondary | ICD-10-CM

## 2015-10-28 DIAGNOSIS — R42 Dizziness and giddiness: Secondary | ICD-10-CM

## 2015-10-28 DIAGNOSIS — R0789 Other chest pain: Secondary | ICD-10-CM

## 2015-10-28 DIAGNOSIS — R51 Headache: Secondary | ICD-10-CM

## 2015-10-28 MED ORDER — RANITIDINE HCL 150 MG PO TABS: 150.0000 mg | ORAL_TABLET | Freq: Two times a day (BID) | ORAL | Status: AC

## 2015-10-28 MED FILL — ATORVASTATIN 20 MG TABLET: 20 | 30 days supply | Qty: 30 | Fill #3

## 2015-10-28 MED FILL — raNITIdine HCL 150 MG TABS: 150 | 30 days supply | Qty: 60 | Fill #0

## 2015-10-28 NOTE — Progress Notes (Signed)
Pt here for dizziness that has been present since December. Pt reports the medication she was prescribed is not helping, but does not know the name of the medication. Dizziness occurs spontaneously and accompanied by HA.

## 2015-10-28 NOTE — Progress Notes (Signed)
Chief Complaint: HA, dizziness and CP  Subjective: 65 yo female presents to walkin clinic with multiple complaints today. We are focusing on 3: 1. Dizziness. Intermittent for months. Initially thought to be related to BP meds but recent adjustments made without improvement. SOmetimes positional. No syncope. Hydrating appropriately. Sometimes with walking, no falls.   2. HA-bilateral temples. Intermittent. Better with Sherri Sear. BP has been ok.   3. CP-atypical. Center chest, sharp, burning. No radiation. No SHOB. No palpitations. Worse with certain foods.    ROS:  GEN: denies fever or chills, denies change in weight HEENT: +headache, denies earache, epistaxis, sore throat, or neck pain +dizziness LUNGS: denies SHOB, dyspnea, PND, orthopnea CV: + CP or palpitations EXT: denies muscle spasms or swelling; no pain in lower ext, no weakness NEURO: denies numbness or tingling, denies sz, stroke or TIA   Objective:  Filed Vitals:   10/28/15 1051  BP: 120/80  Pulse: 95  Temp: 98 F (36.7 C)  TempSrc: Oral  Resp: 12  Height: 5\' 7"  (1.702 m)  Weight: 156 lb (70.761 kg)  SpO2: 98%    General: in no acute distress. HEENT: no pallor, no icterus, moist oral mucosa, no JVD, no lymphadenopathy Heart: Normal  s1 &s2  Regular rate and rhythm, without murmurs, rubs, gallops. Lungs: Clear to auscultation bilaterally. Extremities: No clubbing cyanosis or edema with positive pedal pulses. Neuro: Alert, awake, oriented x3, nonfocal.  Exam benign  Medications: Prior to Admission medications   Medication Sig Start Date End Date Taking? Authorizing Provider  atorvastatin (LIPITOR) 20 MG tablet Take 1 tablet (20 mg total) by mouth daily. 07/21/15  Yes Lance Bosch, NP  amLODipine (NORVASC) 5 MG tablet  06/26/15   Historical Provider, MD  furosemide (LASIX) 20 MG tablet Take 1 tablet (20 mg total) by mouth daily. 07/21/15   Lance Bosch, NP  losartan (COZAAR) 50 MG tablet Take 1 tablet (50  mg total) by mouth daily. 07/15/15   Lance Bosch, NP  meloxicam (MOBIC) 7.5 MG tablet Take 1 tablet (7.5 mg total) by mouth daily. For knee/back pain Patient not taking: Reported on 08/06/2015 06/26/15   Lance Bosch, NP  metroNIDAZOLE (FLAGYL) 500 MG tablet Take 1 tablet (500 mg total) by mouth 2 (two) times daily. Patient not taking: Reported on 08/06/2015 07/21/15   Lance Bosch, NP  nystatin cream (MYCOSTATIN) Apply 1 application topically 2 (two) times daily. Apply to outside of the vaginal area Patient not taking: Reported on 08/06/2015 07/15/15   Lance Bosch, NP  ranitidine (ZANTAC) 150 MG tablet Take 1 tablet (150 mg total) by mouth 2 (two) times daily. 10/28/15   Brayton Caves, PA-C    Assessment: 1. Dizziness ? cause 2. HAs 3. Atypical CP  Plan: Keep eye doc appt on June 1st No change in antihypertensives TED hose Prn pain meds OTC Zantac BID  Follow up:1 mo  The patient was given clear instructions to go to ER or return to medical center if symptoms don't improve, worsen or new problems develop. The patient verbalized understanding. The patient was told to call to get lab results if they haven't heard anything in the next week.   This note has been created with Surveyor, quantity. Any transcriptional errors are unintentional.   Zettie Pho, PA-C 10/28/2015, 11:02 AM

## 2015-11-19 ENCOUNTER — Ambulatory Visit: Payer: No Typology Code available for payment source | Admitting: Podiatry

## 2015-11-19 ENCOUNTER — Telehealth (HOSPITAL_COMMUNITY): Payer: Self-pay | Admitting: *Deleted

## 2015-11-19 ENCOUNTER — Encounter: Payer: Self-pay | Admitting: Podiatry

## 2015-11-19 ENCOUNTER — Telehealth: Payer: Self-pay | Admitting: *Deleted

## 2015-11-19 VITALS — BP 124/80 | HR 106 | Resp 16

## 2015-11-19 DIAGNOSIS — S86011D Strain of right Achilles tendon, subsequent encounter: Secondary | ICD-10-CM

## 2015-11-19 NOTE — Patient Instructions (Signed)
After wearing the boot for approximately 12 weeks still complaining of tenderness in the right heel cord. At this time where the boot on your right foot as needed for pain control I'm ordering a MRI image of the right heel cord and I will notify you of the results[

## 2015-11-19 NOTE — Telephone Encounter (Signed)
Telephoned Debra David at [place] and left message. Advised  BCCCP does not cover MRI and only covers mammograms and pap smears. If any questions could reach me at [phone].

## 2015-11-19 NOTE — Telephone Encounter (Addendum)
-----   Message from Elta Guadeloupe sent at 11/19/2015 10:37 AM EDT ----- Regarding: MRI WO CONTRAST RT FOOT ANKLE ACHILLES TENDON ORDERS ARE IN! THANKS.  Dr. Amalia Hailey ordered MRI with [facility].  I called [facility] to check if they accepted the BCCCP card from [facility]. Amber - [employer] I have to call [phone] for more information. I left message at [facility] office to call with information concerning pt's MRI.  [place] -BCCCP states pt has records of being treated by [facility] [phone].  Left message for Asencion Partridge - [facility] to call me with information concerning MRI coverage. [facility] states they do accept [facility] card.  MRI faxed to [facility]. 11/25/2015-Pt states she is standing at [facility] and they're not open and they don't have her medicine, and they had wanted to set her up on Sunday, but she goes to church.  I told pt she was to get a MRI, but not there and I would call for the time and location. I spoke with [place], pt has an appt 11/28/2015 at 1000am arrive 940am. I left date, time and location address and phone on pt's mobile.  Pt called again stating she has an appt for an MRI this morning and I explained her date, time and location and told he I left a message so she could give it to transportation. Pt states understanding. 12/02/2015-DrAmalia Hailey states refer pt to Dr. Jacqualyn Posey for evaluation of MRI results - osteochondral lesion of the talar dome medial and tendon health. Left message instructing pt to make an appt with Dr. Jacqualyn Posey to discuss MRI results. 12/04/2015-Left message for pt to call for results.

## 2015-11-19 NOTE — Progress Notes (Signed)
Subjective:    Patient ID: Debra David, female    DOB: [birth year], 65 y.o.   MRN: [id number]  HPI  This patient presents today describing intermittent sharp shooting pains in around her ball area of her right and left feet, with the right foot more symptomatic than the left she describes sharp shooting-like pains and a feeling as if rocks are in the ball area of the feet that occur on and off weightbearing for the past 2 years increasing with frequency in the past 6 months. She has changed her shoes and reduced amount of standing walking to accommodate To these uncomfortable sensations.  She is also complaining of pain in the tendo Achilles area for the past 2 years with gradual increasing pain and swelling in the right tendo Achilles. Patient denies any direct injury to the right tendo Achilles. In the past patient has had weightbearing occupation working in [employer], however, is currently retired.Patient currently wearing blue on right lower extremity on a continuous basis since the visit of 08/13/2015 and states that the right heel cord is tender however improving since wearing the boot patient has worn Cam Walker boot for 12 weeks with minimal improvement of the scar fifth right tendo Achilles. She mentions that even when she is wearing the Cam Walker boot he still has tenderness in the right tendo Achilles  She states that the dexamethasone injection given into the right fit on the visit of 08/13/2015 did not stopping ongoing stinging and discomfort in the balls of right and left feet. She states that the right heel cord feels considerably better when wearing the boot. She still continues to complain of intermittent sharp shooting pains on the right and left feet on and off weightbearing with episodes lasting approximately 30 seconds up to 6 times a day resolving spontaneously. Patient has had no relief from dexamethasone injections into the intermetatarsal spaces. She said no relief as well when  she was wearing the boot for the Achilles tendinopathy on the right       Review of Systems  Cardiovascular: Positive for chest pain.  All other systems reviewed and are negative.      Objective:   Physical Exam   Orientated 3  Vascular: No peripheral edema bilaterally DP and PT pulses 2/4 bilaterally Capillary reflex immediate bilaterally  Neurological: Sensation to 10 g monofilament wire intact 4/5 right 5/5 left Vibratory sensation equal and reactive bilaterally Ankle reflex equal and reactive bilaterally  Dermatological: Texture and turgor within normal limits No open skin lesions bilaterally   Musculoskeletal:  Palpable tenderness second,, third, fourth intermetatarsal spaces bilaterally without any palpable lesions Palpable thickening tendo Achilles right which is tender direct palpation and duplicates pain and tendo Achilles area right There is no pain or crepitus on range of motion of ankle, subtalar, midtarsal, metatarsophalangeal joints bilaterally Palpable tenderness in the second, third, fourth intermetatarsal spaces right greater than left without any palpable lesions. 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 I referred her for a noncontrast MRI image of the right tendo Achilles for lack of response he immobilization. Notify patient results of MRI image. Patient instructed to wear a Cam Walker boot as  needed to control pain  Reappoint upon receipt of MRI image

## 2015-11-24 ENCOUNTER — Other Ambulatory Visit: Payer: Self-pay | Admitting: Internal Medicine

## 2015-11-24 MED FILL — raNITIdine HCL 150 MG TABS: 150 | 30 days supply | Qty: 60 | Fill #1

## 2015-11-24 MED FILL — LOSARTAN POTASSIUM 50 MG TA: 50 | 30 days supply | Qty: 30 | Fill #0

## 2015-11-24 MED FILL — ATORVASTATIN 20 MG TABLET: 20 | 30 days supply | Qty: 30 | Fill #4

## 2015-11-28 ENCOUNTER — Ambulatory Visit
Admission: RE | Admit: 2015-11-28 | Discharge: 2015-11-28 | Disposition: A | Discharge status: Home / Self Care | Payer: No Typology Code available for payment source | Source: Ambulatory Visit | Attending: Podiatry | Admitting: Podiatry

## 2015-11-28 DIAGNOSIS — S86011D Strain of right Achilles tendon, subsequent encounter: Secondary | ICD-10-CM

## 2015-12-01 ENCOUNTER — Ambulatory Visit: Payer: Self-pay | Admitting: Internal Medicine

## 2015-12-01 ENCOUNTER — Ambulatory Visit: Payer: Self-pay

## 2015-12-02 NOTE — Telephone Encounter (Signed)
-----   Message from Gean Birchwood, DPM sent at 12/02/2015  8:32 AM EDT ----- MPRESSION: 1. Diffuse fusiform enlargement of the Achilles tendon consistent with tendinosis. No focal tendinitis or tear. 2. Chronic appearing osteochondral lesion of the talar dome medially. No acute osseous findings. 3. The other ankle tendons and ligaments appear normal. Contact patient to review results of MRI image of 11/28/2015

## 2015-12-15 ENCOUNTER — Other Ambulatory Visit: Payer: Self-pay | Admitting: Internal Medicine

## 2015-12-15 DIAGNOSIS — Z9189 Other specified personal risk factors, not elsewhere classified: Secondary | ICD-10-CM

## 2016-01-05 ENCOUNTER — Encounter: Payer: Self-pay | Admitting: Internal Medicine

## 2016-09-07 ENCOUNTER — Encounter: Primary: Legal Medicine

## 2016-11-22 ENCOUNTER — Encounter: Primary: Legal Medicine

## 2016-12-27 ENCOUNTER — Encounter: Primary: Legal Medicine

## 2017-02-21 ENCOUNTER — Encounter

## 2017-02-21 ENCOUNTER — Ambulatory Visit: Admit: 2017-02-21 | Discharge: 2017-02-21 | Attending: Legal Medicine | Primary: Legal Medicine

## 2017-02-21 DIAGNOSIS — H538 Other visual disturbances: Secondary | ICD-10-CM

## 2017-02-21 DIAGNOSIS — Z0000 Encounter for general adult medical examination without abnormal findings: Principal | ICD-10-CM

## 2017-02-21 DIAGNOSIS — I10 Essential (primary) hypertension: Secondary | ICD-10-CM

## 2017-02-21 DIAGNOSIS — Z1231 Encounter for screening mammogram for malignant neoplasm of breast: Secondary | ICD-10-CM

## 2017-02-21 DIAGNOSIS — Z79899 Other long term (current) drug therapy: Secondary | ICD-10-CM

## 2017-02-21 DIAGNOSIS — E785 Hyperlipidemia, unspecified: Secondary | ICD-10-CM

## 2017-02-21 DIAGNOSIS — K219 Gastro-esophageal reflux disease without esophagitis: Secondary | ICD-10-CM

## 2017-02-21 MED ORDER — AMLODIPINE BESYLATE 5 MG PO TABS
5 mg | Freq: Every day | ORAL | 4 refills | Status: CP
Start: 2017-02-21 — End: 2018-03-29

## 2017-02-21 MED ORDER — ATORVASTATIN CALCIUM 20 MG PO TABS
20 mg | Freq: Every day | ORAL | 4 refills | Status: CP
Start: 2017-02-21 — End: 2018-03-29

## 2017-02-21 MED ORDER — LOSARTAN POTASSIUM 50 MG PO TABS
50 mg | Freq: Every day | ORAL | 4 refills | Status: CP
Start: 2017-02-21 — End: 2018-03-29

## 2017-02-21 MED ORDER — RANITIDINE HCL 150 MG PO TABS
150 mg | Freq: Two times a day (BID) | ORAL | 4 refills | Status: CP
Start: 2017-02-21 — End: 2018-05-19

## 2017-02-22 MED FILL — ATORVASTATIN CALCIUM 20MG TABS: 30 days supply | Qty: 30 | Fill #0 | Status: CP

## 2017-02-22 MED FILL — LOSARTAN POTASSIUM 50MG TABS: 30 days supply | Qty: 30 | Fill #0 | Status: CP

## 2017-02-22 MED FILL — AMLODIPINE BESYLATE 5MG TABS: 30 days supply | Qty: 30 | Fill #0 | Status: CP

## 2017-02-22 MED FILL — RANITIDINE HCL 150MG TABS: 30 days supply | Qty: 60 | Fill #0 | Status: CP

## 2017-02-28 ENCOUNTER — Encounter: Primary: Legal Medicine

## 2017-02-28 NOTE — Progress Notes
-       losartan (COZAAR) 50 MG PO Tablet; Take 1 tablet by mouth daily.  -     Refer to Ophthalmology; Future    Hyperlipidemia LDL goal <100  -     atorvastatin (LIPITOR) 20 MG PO Tablet; Take 1 tablet by mouth daily.  -     Refer to Ophthalmology; Future    Gastroesophageal reflux disease, esophagitis presence not specified  -     raNITIdine (ZANTAC) 150 MG PO Tablet; Take 1 tablet by mouth 2 times daily.    Screening mammogram, encounter for  -     Cancel: MAM Screening; Future  -     MAM Screening w [facility]; Future    Blurry vision, bilateral  -     Refer to Ophthalmology; Future          Orders Placed This Encounter   Medications   ? amLODIPine (NORVASC) 5 MG PO Tablet     Sig: Take 1 tablet by mouth daily.     Dispense:  30 tablet     Refill:  4   ? losartan (COZAAR) 50 MG PO Tablet     Sig: Take 1 tablet by mouth daily.     Dispense:  30 tablet     Refill:  4   ? atorvastatin (LIPITOR) 20 MG PO Tablet     Sig: Take 1 tablet by mouth daily.     Dispense:  30 tablet     Refill:  4   ? raNITIdine (ZANTAC) 150 MG PO Tablet     Sig: Take 1 tablet by mouth 2 times daily.     Dispense:  60 tablet     Refill:  4     Orders Placed This Encounter   Procedures   ? MAM Screening w [facility]   ? COMPREHENSIVE METABOLIC PANEL   ? LIPID PANEL   ? MICROALBUMIN URINE RANDOM   ? HEMOGLOBIN A1C W/EAG ([facility])   ? TSH   ? Refer to Ophthalmology       Health Maintenance was reviewed. The patient's HM Topic list was:                                          There are no preventive care reminders to display for this patient.

## 2017-02-28 NOTE — Progress Notes
Education/Communication Barriers?    Learning/Communication Barriers? No    -QO at 02/21/17 1159          Fall Risk Assessment    Had recent fall / Last 6 months? No recent fall    -QO at 02/21/17 1159       Does patient have a fear of falling? No    -QO at 02/21/17 1159         User Key  (r) = Recorded By, (t) = Taken By, (c) = Cosigned By    Initials Name Effective Dates    Carrie Bailey -          GENERAL: Well developed, well nourished, appears stated age, ambulated from waiting area: in no distress.    EYES: PERRLA, Eomi,  Lids without erythema or lesion, conjunctivae pink, sclera white  ENT: external ears symmetric without lesions or deformity, canals clear, TM intact, translucent with normal light reflex.  Oropharynx without lesions, erythema or exudate.      NECK: Supple without thyromegaly, nodules or masses.     CV:  RRR with normal S1/S2, no S3 or S4, no murmur, no rub, no JVD; no carotid bruit, no pedal edema.    RESPIRATORY:  unlabored breathing, symmetric chest wall movement, Lcta b/l w/o wheezes, rales or rhonchi.    ABD:  soft, nondistended, with normoactive bowel sounds.  No hepatosplenomegaly, no masses, nontender to palpation.      MUSCULOSKELETAL: Normal gait and station, spine without deformity, good muscle tone and strength. Digits/nails without cyanosis or clubbing.    NEUROLOGIC: CN: II-XII  intact.  reflexes 2+ and symmetric, no motor deficits.  PSYCH:  oriented to time, person, place;  normal speech and content;  appropriate mood and affect;  interactive and responsive.           Cholesterol, Total   Date Value Ref Range Status   02/21/2017 258 (H) 100 - 199 mg/dL Final     HDL   Date Value Ref Range Status   02/21/2017 69 >=60 mg/dL Final     Triglycerides   Date Value Ref Range Status   02/21/2017 100 <=150 mg/dL Final     LDL Calculated   Date Value Ref Range Status   02/21/2017 169 mg/dL Final     Hemoglobin Z6X   Date Value Ref Range Status

## 2017-02-28 NOTE — Progress Notes
02/21/2017 13.3 12.0 - 16.1 % Final     Platelet Count   Date Value Ref Range Status   02/21/2017 349 140 - 440 thou/cu mm Final     MPV   Date Value Ref Range Status   02/21/2017 10.8 9.5 - 11.5 fl Final     Lymphs %   Date Value Ref Range Status   02/21/2017 56 % Final     Monocytes %   Date Value Ref Range Status   02/21/2017 10 (H) 2 - 6 % Final           Assessment       ICD-10-CM ICD-9-CM    1. Routine general medical examination at a health care facility Z00.00 V70.0 CBC AND DIFFERENTIAL      COMPREHENSIVE METABOLIC PANEL      LIPID PANEL      MICROALBUMIN URINE RANDOM      HEMOGLOBIN A1C W/EAG ([facility])      TSH   2. Essential hypertension with goal blood pressure less than 130/80 I10 401.9 COMPREHENSIVE METABOLIC PANEL      MICROALBUMIN URINE RANDOM      amLODIPine (NORVASC) 5 MG PO Tablet      losartan (COZAAR) 50 MG PO Tablet      Refer to Ophthalmology   3. Hyperlipidemia LDL goal <100 E78.5 272.4 atorvastatin (LIPITOR) 20 MG PO Tablet      Refer to Ophthalmology   4. Gastroesophageal reflux disease, esophagitis presence not specified K21.9 530.81 raNITIdine (ZANTAC) 150 MG PO Tablet   5. Screening mammogram, encounter for Z12.31 V76.12 MAM Screening w [facility]      CANCELED: MAM Screening   6. Blurry vision, bilateral H53.8 368.8 Refer to Ophthalmology         Plan     Carrie Bailey was seen today for new patient.    Diagnoses and all orders for this visit:    Routine general medical examination at a health care facility  -     CBC AND DIFFERENTIAL; Future  -     COMPREHENSIVE METABOLIC PANEL; Future  -     LIPID PANEL; Future  -     MICROALBUMIN URINE RANDOM; Future  -     HEMOGLOBIN A1C W/EAG ([facility]); Future  -     TSH; Future    Essential hypertension with goal blood pressure less than 130/80  -     COMPREHENSIVE METABOLIC PANEL; Future  -     MICROALBUMIN URINE RANDOM; Future  -     amLODIPine (NORVASC) 5 MG PO Tablet; Take 1 tablet by mouth daily.

## 2017-02-28 NOTE — Progress Notes
02/21/2017 11.8 (H) 4.8 - 5.9 % Final     Comment:     Target range based on NGSP/ADA reccommendations.     TSH   Date Value Ref Range Status   02/21/2017 1.280 0.270 - 4.200 mIU/L Final     Comment:       Results may be affected by exogenous biotin consumption. Please consult the laboratory if an interference is suspected.     Potassium   Date Value Ref Range Status   02/21/2017 4.9 (H) 3.3 - 4.6 mmol/L Final     Calcium   Date Value Ref Range Status   02/21/2017 9.6 8.6 - 10.0 mg/dL Final     Chloride   Date Value Ref Range Status   02/21/2017 104 101 - 110 mmol/L Final     CO2   Date Value Ref Range Status   02/21/2017 27 21 - 29 mmol/L Final     Glucose   Date Value Ref Range Status   02/21/2017 261 (H) 71 - 99 mg/dL Final     Urea Nitrogen   Date Value Ref Range Status   02/21/2017 8 6 - 22 mg/dL Final     Creatinine   Date Value Ref Range Status   02/21/2017 0.79 0.51 - 0.95 mg/dL Final     Albumin   Date Value Ref Range Status   02/21/2017 4.4 3.8 - 4.9 g/dL Final     Total Bilirubin   Date Value Ref Range Status   02/21/2017 0.5 0.2 - 1.0 mg/dL Final     Alkaline Phosphatase   Date Value Ref Range Status   02/21/2017 166 (H) 35 - 104 IU/L Final     AST   Date Value Ref Range Status   02/21/2017 19 14 - 33 IU/L Final     ALT   Date Value Ref Range Status   02/21/2017 20 10 - 42 IU/L Final     WBC   Date Value Ref Range Status   02/21/2017 7.58 4.5 - 11 x10E3/uL Final     RBC   Date Value Ref Range Status   02/21/2017 4.27 4.20 - 5.40 x10E6/uL Final     Hematocrit   Date Value Ref Range Status   02/21/2017 40.0 37.0 - 47.0 % Final     Hemoglobin   Date Value Ref Range Status   02/21/2017 12.4 12.0 - 16.0 g/dL Final     MCV   Date Value Ref Range Status   02/21/2017 93.7 82.0 - 101.0 fl Final     MCH   Date Value Ref Range Status   02/21/2017 29.0 27.0 - 34.0 pg Final     MCHC   Date Value Ref Range Status   02/21/2017 31.0 31.0 - 36.0 g/dL Final     RDW   Date Value Ref Range Status

## 2017-02-28 NOTE — Progress Notes
Reason:  Physician ordered labs cmp,cbc,a1c,lipid, tsh   Amount:  3 Tubes  Type:  Butterfly  Site:  Vein  left arm  Reaction:  None    Draw performed by:  [id number],  02/21/2017

## 2017-02-28 NOTE — Progress Notes
HPI      Carrie Bailey is a 66 y.o. female here for New Patient    [name] is presenting today to establish care in the [facility] as a New Patient. She has a history of Htn, Hld, and Gerd.  She c/o her vision being blurry.   Bp not controlled; however, she is without her medications.       No past medical history on file.  No past surgical history on file.  No family history on file.  Social History   Substance Use Topics   ? Smoking status: Not on file   ? Smokeless tobacco: Not on file   ? Alcohol use Not on file     No current outpatient prescriptions on file prior to visit.     No current facility-administered medications on file prior to visit.      No Known Allergies      Review of Systems     General: no fever, fatigue or unexplained weight loss or gain  HEENT: blurry vision bilateral.  Cardiovascular: no chest pain, palpitations or syncope  Respiratory: no cough, wheezing or shortness of breath  GI: Positive for  reflux symptoms  GU: no complaint of frequency, dysuria or hematuria  Endocrine: no complaint of temperature intolerance or diabetic symptoms  Psychiatric:  no issues with anxiety, depression or insomnia        Physical Exam        VITAL SIGNS (all recorded)      Clinic Vitals     Row Name 02/21/17 1158                Amb Encounter Vitals    Weight 65.9 kg (145 lb 3.2 oz)    -QO at 02/21/17 1159       Height 1.702 m ( )    -QO at 02/21/17 1159       BMI (Calculated) 22.79    -QO at 02/21/17 1159       BSA (Calculated - sq m) 1.76    -QO at 02/21/17 1159       BP 185/86    -QO at 02/21/17 1159       BP Location Right upper arm    -QO at 02/21/17 1159       Position Sitting    -QO at 02/21/17 1159       Pulse 81    -QO at 02/21/17 1159       Pulse Source Radial    -QO at 02/21/17 1159       Pulse Quality Normal    -QO at 02/21/17 1159       Resp 18    -QO at 02/21/17 1159       Respiration Quality Normal    -QO at 02/21/17 1159       Pain Score Five    -QO at 02/21/17 1159

## 2017-03-07 ENCOUNTER — Inpatient Hospital Stay: Admit: 2017-03-07 | Discharge: 2017-03-08 | Primary: Legal Medicine

## 2017-03-07 DIAGNOSIS — Z1231 Encounter for screening mammogram for malignant neoplasm of breast: Principal | ICD-10-CM

## 2017-03-18 MED FILL — AMLODIPINE BESYLATE 5MG TABS: 30 days supply | Qty: 30 | Fill #1 | Status: CP

## 2017-03-18 MED FILL — RANITIDINE HCL 150MG TABS: 30 days supply | Qty: 60 | Fill #1 | Status: CP

## 2017-03-18 MED FILL — ATORVASTATIN CALCIUM 20MG TABS: 30 days supply | Qty: 30 | Fill #1 | Status: CP

## 2017-03-18 MED FILL — LOSARTAN POTASSIUM 50MG TABS: 30 days supply | Qty: 30 | Fill #1 | Status: CP

## 2017-03-22 ENCOUNTER — Ambulatory Visit: Attending: Optometrist | Primary: Legal Medicine

## 2017-03-22 DIAGNOSIS — M1990 Unspecified osteoarthritis, unspecified site: Principal | ICD-10-CM

## 2017-03-22 DIAGNOSIS — I10 Essential (primary) hypertension: Secondary | ICD-10-CM

## 2017-03-22 DIAGNOSIS — H538 Other visual disturbances: Secondary | ICD-10-CM

## 2017-03-22 DIAGNOSIS — E785 Hyperlipidemia, unspecified: Secondary | ICD-10-CM

## 2017-03-22 DIAGNOSIS — H04123 Dry eye syndrome of bilateral lacrimal glands: Secondary | ICD-10-CM

## 2017-03-22 DIAGNOSIS — H25813 Combined forms of age-related cataract, bilateral: Principal | ICD-10-CM

## 2017-03-22 DIAGNOSIS — G43909 Migraine, unspecified, not intractable, without status migrainosus: Secondary | ICD-10-CM

## 2017-03-22 NOTE — Progress Notes
Moderate Cataract OU   BCVA 20/30 OD, 20/40 OS  Ed pt on condition. Discussed likelihood of CE in the future. Monitor for now.     Dry Eye Syndrome OU  Educated patient on condition. Recommended artificial tears qid OU.     Refractive Error OU  Recommended refraction.     Monitor 6mos DFE, sooner prn.

## 2017-04-06 DIAGNOSIS — R928 Other abnormal and inconclusive findings on diagnostic imaging of breast: Principal | ICD-10-CM

## 2017-05-04 ENCOUNTER — Encounter: Attending: Legal Medicine | Primary: Legal Medicine

## 2017-05-17 ENCOUNTER — Encounter: Attending: Optometrist | Primary: Legal Medicine

## 2017-05-23 ENCOUNTER — Inpatient Hospital Stay: Attending: Legal Medicine | Primary: Legal Medicine

## 2017-05-26 ENCOUNTER — Encounter: Primary: Legal Medicine

## 2017-08-23 ENCOUNTER — Encounter: Attending: Optometrist | Primary: Legal Medicine

## 2017-08-25 ENCOUNTER — Encounter: Primary: Legal Medicine

## 2017-08-30 ENCOUNTER — Encounter: Primary: Legal Medicine

## 2017-09-01 ENCOUNTER — Encounter: Primary: Legal Medicine

## 2017-09-05 ENCOUNTER — Encounter: Attending: Ophthalmology | Primary: Legal Medicine

## 2017-09-20 ENCOUNTER — Encounter: Attending: Optometrist | Primary: Legal Medicine

## 2017-10-21 ENCOUNTER — Encounter: Attending: Optometrist | Primary: Legal Medicine

## 2017-10-21 ENCOUNTER — Ambulatory Visit: Attending: Ophthalmology | Primary: Legal Medicine

## 2017-10-21 DIAGNOSIS — G43909 Migraine, unspecified, not intractable, without status migrainosus: Secondary | ICD-10-CM

## 2017-10-21 DIAGNOSIS — I10 Essential (primary) hypertension: Secondary | ICD-10-CM

## 2017-10-21 DIAGNOSIS — M1990 Unspecified osteoarthritis, unspecified site: Principal | ICD-10-CM

## 2017-10-21 DIAGNOSIS — H04123 Dry eye syndrome of bilateral lacrimal glands: Principal | ICD-10-CM

## 2017-10-21 DIAGNOSIS — H25813 Combined forms of age-related cataract, bilateral: Secondary | ICD-10-CM

## 2018-02-22 ENCOUNTER — Encounter: Attending: Ophthalmology | Primary: Legal Medicine

## 2018-02-23 ENCOUNTER — Ambulatory Visit: Attending: Ophthalmology | Primary: Legal Medicine

## 2018-02-23 DIAGNOSIS — I10 Essential (primary) hypertension: Secondary | ICD-10-CM

## 2018-02-23 DIAGNOSIS — H25813 Combined forms of age-related cataract, bilateral: Principal | ICD-10-CM

## 2018-02-23 DIAGNOSIS — Z79899 Other long term (current) drug therapy: Secondary | ICD-10-CM

## 2018-02-23 DIAGNOSIS — H25811 Combined forms of age-related cataract, right eye: Secondary | ICD-10-CM

## 2018-02-23 DIAGNOSIS — G43909 Migraine, unspecified, not intractable, without status migrainosus: Secondary | ICD-10-CM

## 2018-02-23 DIAGNOSIS — H04123 Dry eye syndrome of bilateral lacrimal glands: Secondary | ICD-10-CM

## 2018-02-23 DIAGNOSIS — H269 Unspecified cataract: Secondary | ICD-10-CM

## 2018-02-23 DIAGNOSIS — M1990 Unspecified osteoarthritis, unspecified site: Principal | ICD-10-CM

## 2018-02-23 MED ORDER — CYCLOPENTOLATE HCL 1 % OP SOLN
1 [drp] | OPHTHALMIC | Status: CN
Start: 2018-02-23 — End: ?

## 2018-02-23 MED ORDER — PHENYLEPHRINE HCL 2.5 % OP SOLN
1 [drp] | OPHTHALMIC | Status: CN
Start: 2018-02-23 — End: ?

## 2018-02-23 MED ORDER — TROPICAMIDE 1 % OP SOLN
1 [drp] | OPHTHALMIC | Status: CN
Start: 2018-02-23 — End: ?

## 2018-02-23 MED ORDER — PROPARACAINE HCL 0.5 % OP SOLN
1 [drp] | Freq: Once | OPHTHALMIC | Status: CN
Start: 2018-02-23 — End: ?

## 2018-02-23 MED ORDER — PROPARACAINE HCL 0.5 % OP SOLN
1 [drp] | OPHTHALMIC | Status: CN
Start: 2018-02-23 — End: ?

## 2018-02-23 MED ORDER — LIDOCAINE HCL 2 % EX GEL GLYDO JX
Freq: Once | TOPICAL | Status: CN
Start: 2018-02-23 — End: ?

## 2018-03-22 ENCOUNTER — Ambulatory Visit: Attending: Ophthalmology | Primary: Legal Medicine

## 2018-03-22 DIAGNOSIS — H25811 Combined forms of age-related cataract, right eye: Principal | ICD-10-CM

## 2018-03-27 ENCOUNTER — Ambulatory Visit

## 2018-03-27 ENCOUNTER — Encounter: Attending: Family | Primary: Legal Medicine

## 2018-03-27 ENCOUNTER — Inpatient Hospital Stay

## 2018-03-27 DIAGNOSIS — I10 Essential (primary) hypertension: Secondary | ICD-10-CM

## 2018-03-27 DIAGNOSIS — Z79899 Other long term (current) drug therapy: Secondary | ICD-10-CM

## 2018-03-27 DIAGNOSIS — H269 Unspecified cataract: Principal | ICD-10-CM

## 2018-03-27 DIAGNOSIS — M1990 Unspecified osteoarthritis, unspecified site: Principal | ICD-10-CM

## 2018-03-27 DIAGNOSIS — G43909 Migraine, unspecified, not intractable, without status migrainosus: Secondary | ICD-10-CM

## 2018-03-27 DIAGNOSIS — H25811 Combined forms of age-related cataract, right eye: Secondary | ICD-10-CM

## 2018-03-27 MED ORDER — PREDNISOLONE ACETATE 1 % OP SUSP
Status: DC | PRN
Start: 2018-03-27 — End: 2018-03-27

## 2018-03-27 MED ORDER — PROPARACAINE HCL 0.5 % OP SOLN
1 [drp] | OPHTHALMIC | Status: CP
Start: 2018-03-27 — End: ?

## 2018-03-27 MED ORDER — MOXIFLOXACIN HCL 0.5 % OP SOLN
Status: DC | PRN
Start: 2018-03-27 — End: 2018-03-27

## 2018-03-27 MED ORDER — MOXIFLOXACIN HCL 0.5 % OP SOLN
1 [drp] | Freq: Four times a day (QID) | OPHTHALMIC | Status: DC
Start: 2018-03-27 — End: 2018-03-28

## 2018-03-27 MED ORDER — LIDOCAINE HCL 2 % EX GEL GLYDO JX
Freq: Once | TOPICAL | Status: CP
Start: 2018-03-27 — End: ?

## 2018-03-27 MED ORDER — LACTATED RINGERS IV SOLN
Status: DC | PRN
Start: 2018-03-27 — End: 2018-03-27

## 2018-03-27 MED ORDER — KETOROLAC TROMETHAMINE 0.5 % OP SOLN
Status: DC | PRN
Start: 2018-03-27 — End: 2018-03-27

## 2018-03-27 MED ORDER — TROPICAMIDE 1 % OP SOLN
1 [drp] | OPHTHALMIC | Status: CP
Start: 2018-03-27 — End: ?

## 2018-03-27 MED ORDER — CYCLOPENTOLATE HCL 1 % OP SOLN
1 [drp] | OPHTHALMIC | Status: CP
Start: 2018-03-27 — End: ?

## 2018-03-27 MED ORDER — ACETAMINOPHEN 500 MG PO TABS
500 mg | Freq: Once | ORAL | Status: CP
Start: 2018-03-27 — End: ?

## 2018-03-27 MED ORDER — EPINEPHRINE HCL 1 MG/ML IJ SOLN CUSTOM SH
Status: DC | PRN
Start: 2018-03-27 — End: 2018-03-27

## 2018-03-27 MED ORDER — MIDAZOLAM HCL 2 MG/2ML IJ SOLN
Status: DC | PRN
Start: 2018-03-27 — End: 2018-03-27

## 2018-03-27 MED ORDER — LACTATED RINGERS IV SOLN
1000 mL | Freq: Once | INTRAVENOUS | Status: CP
Start: 2018-03-27 — End: ?

## 2018-03-27 MED ORDER — PROPARACAINE HCL 0.5 % OP SOLN
1 [drp] | Freq: Once | OPHTHALMIC | Status: CP
Start: 2018-03-27 — End: ?

## 2018-03-27 MED ORDER — PREDNISOLONE ACETATE 1 % OP SUSP
1 [drp] | Freq: Four times a day (QID) | OPHTHALMIC | Status: DC
Start: 2018-03-27 — End: 2018-03-28

## 2018-03-27 MED ORDER — KETOROLAC TROMETHAMINE 0.5 % OP SOLN
1 [drp] | Freq: Four times a day (QID) | OPHTHALMIC | Status: DC
Start: 2018-03-27 — End: 2018-03-28

## 2018-03-27 MED ORDER — PHENYLEPHRINE HCL 2.5 % OP SOLN
1 [drp] | OPHTHALMIC | Status: CP
Start: 2018-03-27 — End: ?

## 2018-03-28 ENCOUNTER — Encounter: Attending: Ophthalmology | Primary: Legal Medicine

## 2018-03-28 DIAGNOSIS — I10 Essential (primary) hypertension: Secondary | ICD-10-CM

## 2018-03-28 DIAGNOSIS — M1990 Unspecified osteoarthritis, unspecified site: Principal | ICD-10-CM

## 2018-03-28 DIAGNOSIS — G43909 Migraine, unspecified, not intractable, without status migrainosus: Secondary | ICD-10-CM

## 2018-03-29 ENCOUNTER — Ambulatory Visit: Admit: 2018-03-29 | Discharge: 2018-03-29 | Attending: Family | Primary: Legal Medicine

## 2018-03-29 ENCOUNTER — Encounter

## 2018-03-29 DIAGNOSIS — E785 Hyperlipidemia, unspecified: Secondary | ICD-10-CM

## 2018-03-29 DIAGNOSIS — Z7189 Other specified counseling: Principal | ICD-10-CM

## 2018-03-29 DIAGNOSIS — Z79899 Other long term (current) drug therapy: Secondary | ICD-10-CM

## 2018-03-29 DIAGNOSIS — I10 Essential (primary) hypertension: Secondary | ICD-10-CM

## 2018-03-29 DIAGNOSIS — M1990 Unspecified osteoarthritis, unspecified site: Principal | ICD-10-CM

## 2018-03-29 DIAGNOSIS — Z114 Encounter for screening for human immunodeficiency virus [HIV]: Secondary | ICD-10-CM

## 2018-03-29 DIAGNOSIS — Z0000 Encounter for general adult medical examination without abnormal findings: Secondary | ICD-10-CM

## 2018-03-29 DIAGNOSIS — Z113 Encounter for screening for infections with a predominantly sexual mode of transmission: Secondary | ICD-10-CM

## 2018-03-29 DIAGNOSIS — G43909 Migraine, unspecified, not intractable, without status migrainosus: Secondary | ICD-10-CM

## 2018-03-29 DIAGNOSIS — Z0189 Encounter for other specified special examinations: Principal | ICD-10-CM

## 2018-03-29 MED ORDER — LOSARTAN POTASSIUM 50 MG PO TABS
50 mg | Freq: Every day | ORAL | 3 refills | Status: CP
Start: 2018-03-29 — End: 2018-05-19

## 2018-03-29 MED ORDER — AMLODIPINE BESYLATE 5 MG PO TABS
5 mg | Freq: Every day | ORAL | 3 refills | Status: CP
Start: 2018-03-29 — End: 2018-05-19

## 2018-03-29 MED ORDER — ATORVASTATIN CALCIUM 20 MG PO TABS
20 mg | Freq: Every day | ORAL | 3 refills | Status: CP
Start: 2018-03-29 — End: ?

## 2018-04-04 ENCOUNTER — Ambulatory Visit: Attending: Ophthalmology | Primary: Legal Medicine

## 2018-04-04 DIAGNOSIS — I10 Essential (primary) hypertension: Secondary | ICD-10-CM

## 2018-04-04 DIAGNOSIS — Z961 Presence of intraocular lens: Secondary | ICD-10-CM

## 2018-04-04 DIAGNOSIS — M1990 Unspecified osteoarthritis, unspecified site: Principal | ICD-10-CM

## 2018-04-04 DIAGNOSIS — H25811 Combined forms of age-related cataract, right eye: Principal | ICD-10-CM

## 2018-04-04 DIAGNOSIS — G43909 Migraine, unspecified, not intractable, without status migrainosus: Secondary | ICD-10-CM

## 2018-04-04 MED ORDER — PREDNISOLONE ACETATE 1 % OP SUSP
1 [drp] | Freq: Three times a day (TID) | OPHTHALMIC | 1 refills | Status: CP
Start: 2018-04-04 — End: 2018-05-09

## 2018-04-25 ENCOUNTER — Ambulatory Visit: Attending: Ophthalmology | Primary: Legal Medicine

## 2018-04-25 DIAGNOSIS — H25812 Combined forms of age-related cataract, left eye: Principal | ICD-10-CM

## 2018-04-25 DIAGNOSIS — Z961 Presence of intraocular lens: Principal | ICD-10-CM

## 2018-04-25 DIAGNOSIS — M1990 Unspecified osteoarthritis, unspecified site: Principal | ICD-10-CM

## 2018-04-25 DIAGNOSIS — H269 Unspecified cataract: Secondary | ICD-10-CM

## 2018-04-25 DIAGNOSIS — I10 Essential (primary) hypertension: Secondary | ICD-10-CM

## 2018-04-25 DIAGNOSIS — G43909 Migraine, unspecified, not intractable, without status migrainosus: Secondary | ICD-10-CM

## 2018-04-25 DIAGNOSIS — Z79899 Other long term (current) drug therapy: Secondary | ICD-10-CM

## 2018-04-25 MED ORDER — PROPARACAINE HCL 0.5 % OP SOLN
1 [drp] | Freq: Once | OPHTHALMIC | Status: CN
Start: 2018-04-25 — End: ?

## 2018-04-25 MED ORDER — LIDOCAINE HCL 2 % EX GEL GLYDO JX
Freq: Once | TOPICAL | Status: CN
Start: 2018-04-25 — End: ?

## 2018-04-25 MED ORDER — PHENYLEPHRINE HCL 2.5 % OP SOLN
1 [drp] | OPHTHALMIC | Status: CN
Start: 2018-04-25 — End: ?

## 2018-04-25 MED ORDER — CYCLOPENTOLATE HCL 1 % OP SOLN
1 [drp] | OPHTHALMIC | Status: CN
Start: 2018-04-25 — End: ?

## 2018-04-25 MED ORDER — PROPARACAINE HCL 0.5 % OP SOLN
1 [drp] | OPHTHALMIC | Status: CN
Start: 2018-04-25 — End: ?

## 2018-04-25 MED ORDER — TROPICAMIDE 1 % OP SOLN
1 [drp] | OPHTHALMIC | Status: CN
Start: 2018-04-25 — End: ?

## 2018-04-26 ENCOUNTER — Ambulatory Visit: Attending: Ophthalmology | Primary: Legal Medicine

## 2018-04-26 DIAGNOSIS — H25812 Combined forms of age-related cataract, left eye: Principal | ICD-10-CM

## 2018-04-30 DIAGNOSIS — H25812 Combined forms of age-related cataract, left eye: Principal | ICD-10-CM

## 2018-04-30 DIAGNOSIS — H269 Unspecified cataract: Secondary | ICD-10-CM

## 2018-04-30 DIAGNOSIS — Z79899 Other long term (current) drug therapy: Secondary | ICD-10-CM

## 2018-04-30 DIAGNOSIS — I10 Essential (primary) hypertension: Secondary | ICD-10-CM

## 2018-05-01 ENCOUNTER — Ambulatory Visit

## 2018-05-01 ENCOUNTER — Inpatient Hospital Stay

## 2018-05-01 DIAGNOSIS — Z79899 Other long term (current) drug therapy: Secondary | ICD-10-CM

## 2018-05-01 DIAGNOSIS — H25812 Combined forms of age-related cataract, left eye: Principal | ICD-10-CM

## 2018-05-01 DIAGNOSIS — I10 Essential (primary) hypertension: Secondary | ICD-10-CM

## 2018-05-01 DIAGNOSIS — M1990 Unspecified osteoarthritis, unspecified site: Principal | ICD-10-CM

## 2018-05-01 DIAGNOSIS — H269 Unspecified cataract: Secondary | ICD-10-CM

## 2018-05-01 DIAGNOSIS — G43909 Migraine, unspecified, not intractable, without status migrainosus: Secondary | ICD-10-CM

## 2018-05-01 MED ORDER — PROMETHAZINE HCL 25 MG/ML IJ SOLN
6.25 mg | INTRAVENOUS | Status: DC | PRN
Start: 2018-05-01 — End: 2018-05-02

## 2018-05-01 MED ORDER — PREDNISOLONE ACETATE 1 % OP SUSP
Status: DC | PRN
Start: 2018-05-01 — End: 2018-05-01

## 2018-05-01 MED ORDER — NALBUPHINE HCL 10 MG/ML IJ SOLN
2.5 mg | INTRAVENOUS | Status: DC | PRN
Start: 2018-05-01 — End: 2018-05-02

## 2018-05-01 MED ORDER — PROPARACAINE HCL 0.5 % OP SOLN
1 [drp] | OPHTHALMIC | Status: CP
Start: 2018-05-01 — End: ?

## 2018-05-01 MED ORDER — KETOROLAC TROMETHAMINE 0.5 % OP SOLN
1 [drp] | Freq: Four times a day (QID) | OPHTHALMIC | Status: DC
Start: 2018-05-01 — End: 2018-05-02

## 2018-05-01 MED ORDER — MOXIFLOXACIN HCL 0.5 % OP SOLN
1 [drp] | Freq: Four times a day (QID) | OPHTHALMIC | Status: DC
Start: 2018-05-01 — End: 2018-05-02

## 2018-05-01 MED ORDER — PROPARACAINE HCL 0.5 % OP SOLN
1 [drp] | Freq: Once | OPHTHALMIC | Status: CP
Start: 2018-05-01 — End: ?

## 2018-05-01 MED ORDER — HALOPERIDOL LACTATE 5 MG/ML IJ SOLN
.5 mg | INTRAVENOUS | Status: DC | PRN
Start: 2018-05-01 — End: 2018-05-02

## 2018-05-01 MED ORDER — FENTANYL CITRATE INJ 50 MCG/ML CUSTOM AMP/VIAL
Status: DC | PRN
Start: 2018-05-01 — End: 2018-05-01

## 2018-05-01 MED ORDER — TROPICAMIDE 1 % OP SOLN
1 [drp] | OPHTHALMIC | Status: CP
Start: 2018-05-01 — End: ?

## 2018-05-01 MED ORDER — PHENYLEPHRINE HCL 2.5 % OP SOLN
1 [drp] | OPHTHALMIC | Status: CP
Start: 2018-05-01 — End: ?

## 2018-05-01 MED ORDER — LACTATED RINGERS IV SOLN
1000 mL | Freq: Once | INTRAVENOUS | Status: CP
Start: 2018-05-01 — End: ?

## 2018-05-01 MED ORDER — MOXIFLOXACIN HCL 0.5 % OP SOLN
Status: DC | PRN
Start: 2018-05-01 — End: 2018-05-01

## 2018-05-01 MED ORDER — CYCLOPENTOLATE HCL 1 % OP SOLN
1 [drp] | OPHTHALMIC | Status: CP
Start: 2018-05-01 — End: ?

## 2018-05-01 MED ORDER — PREDNISOLONE ACETATE 1 % OP SUSP
1 [drp] | Freq: Four times a day (QID) | OPHTHALMIC | Status: DC
Start: 2018-05-01 — End: 2018-05-02

## 2018-05-01 MED ORDER — MIDAZOLAM HCL 2 MG/2ML IJ SOLN
Status: DC | PRN
Start: 2018-05-01 — End: 2018-05-01

## 2018-05-01 MED ORDER — LIDOCAINE HCL 2 % EX GEL GLYDO JX
Freq: Once | TOPICAL | Status: CP
Start: 2018-05-01 — End: ?

## 2018-05-01 MED ORDER — KETOROLAC TROMETHAMINE 0.5 % OP SOLN
Status: DC | PRN
Start: 2018-05-01 — End: 2018-05-01

## 2018-05-01 MED ORDER — EPINEPHRINE HCL 1 MG/ML IJ SOLN CUSTOM SH
Status: DC | PRN
Start: 2018-05-01 — End: 2018-05-01

## 2018-05-02 ENCOUNTER — Ambulatory Visit: Attending: Ophthalmology | Primary: Legal Medicine

## 2018-05-02 DIAGNOSIS — Z961 Presence of intraocular lens: Secondary | ICD-10-CM

## 2018-05-02 DIAGNOSIS — I10 Essential (primary) hypertension: Secondary | ICD-10-CM

## 2018-05-02 DIAGNOSIS — G43909 Migraine, unspecified, not intractable, without status migrainosus: Secondary | ICD-10-CM

## 2018-05-02 DIAGNOSIS — M1990 Unspecified osteoarthritis, unspecified site: Principal | ICD-10-CM

## 2018-05-09 ENCOUNTER — Ambulatory Visit: Attending: Ophthalmology | Primary: Legal Medicine

## 2018-05-09 DIAGNOSIS — M1990 Unspecified osteoarthritis, unspecified site: Principal | ICD-10-CM

## 2018-05-09 DIAGNOSIS — I10 Essential (primary) hypertension: Secondary | ICD-10-CM

## 2018-05-09 DIAGNOSIS — Z961 Presence of intraocular lens: Secondary | ICD-10-CM

## 2018-05-09 DIAGNOSIS — G43909 Migraine, unspecified, not intractable, without status migrainosus: Secondary | ICD-10-CM

## 2018-05-09 DIAGNOSIS — H25811 Combined forms of age-related cataract, right eye: Principal | ICD-10-CM

## 2018-05-09 MED ORDER — PREDNISOLONE ACETATE 1 % OP SUSP
1 [drp] | Freq: Three times a day (TID) | OPHTHALMIC | 1 refills | Status: CP
Start: 2018-05-09 — End: ?

## 2018-05-09 MED ORDER — PREDNISOLONE ACETATE 1 % OP SUSP
1 [drp] | Freq: Three times a day (TID) | OPHTHALMIC | 1 refills | Status: CP
Start: 2018-05-09 — End: 2018-05-09

## 2018-05-18 ENCOUNTER — Encounter

## 2018-05-18 ENCOUNTER — Ambulatory Visit: Admit: 2018-05-18 | Discharge: 2018-05-18 | Attending: Legal Medicine | Primary: Legal Medicine

## 2018-05-18 DIAGNOSIS — Z9849 Cataract extraction status, unspecified eye: Secondary | ICD-10-CM

## 2018-05-18 DIAGNOSIS — G43909 Migraine, unspecified, not intractable, without status migrainosus: Secondary | ICD-10-CM

## 2018-05-18 DIAGNOSIS — M1990 Unspecified osteoarthritis, unspecified site: Principal | ICD-10-CM

## 2018-05-18 DIAGNOSIS — E785 Hyperlipidemia, unspecified: Secondary | ICD-10-CM

## 2018-05-18 DIAGNOSIS — Z7984 Long term (current) use of oral hypoglycemic drugs: Secondary | ICD-10-CM

## 2018-05-18 DIAGNOSIS — I10 Essential (primary) hypertension: Principal | ICD-10-CM

## 2018-05-18 DIAGNOSIS — Z79899 Other long term (current) drug therapy: Secondary | ICD-10-CM

## 2018-05-18 DIAGNOSIS — Z1159 Encounter for screening for other viral diseases: Principal | ICD-10-CM

## 2018-05-18 DIAGNOSIS — E119 Type 2 diabetes mellitus without complications: Secondary | ICD-10-CM

## 2018-05-18 DIAGNOSIS — Z961 Presence of intraocular lens: Secondary | ICD-10-CM

## 2018-05-18 DIAGNOSIS — Z0189 Encounter for other specified special examinations: Principal | ICD-10-CM

## 2018-05-18 DIAGNOSIS — Z0000 Encounter for general adult medical examination without abnormal findings: Secondary | ICD-10-CM

## 2018-05-18 MED ORDER — METFORMIN HCL 500 MG PO TABS
500 mg | Freq: Two times a day (BID) | ORAL | 1 refills | Status: CP
Start: 2018-05-18 — End: ?

## 2018-05-18 MED ORDER — AMLODIPINE BESYLATE 5 MG PO TABS
5 mg | Freq: Every day | ORAL | 1 refills | Status: CP
Start: 2018-05-18 — End: 2018-05-19

## 2018-05-18 MED ORDER — AMLODIPINE BESYLATE 5 MG PO TABS
5 mg | Freq: Every day | ORAL | 1 refills | Status: CP
Start: 2018-05-18 — End: ?

## 2018-05-30 ENCOUNTER — Ambulatory Visit: Attending: Ophthalmology | Primary: Legal Medicine

## 2018-05-30 DIAGNOSIS — I10 Essential (primary) hypertension: Secondary | ICD-10-CM

## 2018-05-30 DIAGNOSIS — Z961 Presence of intraocular lens: Secondary | ICD-10-CM

## 2018-05-30 DIAGNOSIS — G43909 Migraine, unspecified, not intractable, without status migrainosus: Secondary | ICD-10-CM

## 2018-05-30 DIAGNOSIS — M1990 Unspecified osteoarthritis, unspecified site: Principal | ICD-10-CM

## 2018-06-20 ENCOUNTER — Encounter: Primary: Legal Medicine

## 2018-06-20 ENCOUNTER — Encounter: Attending: Optometrist | Primary: Legal Medicine

## 2018-06-20 DIAGNOSIS — H524 Presbyopia: Principal | ICD-10-CM

## 2018-07-13 ENCOUNTER — Encounter: Attending: Legal Medicine | Primary: Legal Medicine

## 2018-07-19 ENCOUNTER — Encounter: Attending: Legal Medicine | Primary: Legal Medicine

## 2018-09-11 DIAGNOSIS — I10 Essential (primary) hypertension: Secondary | ICD-10-CM

## 2018-09-11 DIAGNOSIS — Z79899 Other long term (current) drug therapy: Principal | ICD-10-CM

## 2018-09-11 MED ORDER — AMLODIPINE BESYLATE 5 MG PO TABS
1 refills | Status: CP
Start: 2018-09-11 — End: ?

## 2018-09-26 ENCOUNTER — Encounter: Attending: Ophthalmology | Primary: Legal Medicine

## 2019-01-12 DIAGNOSIS — Z79899 Other long term (current) drug therapy: Principal | ICD-10-CM

## 2019-01-12 DIAGNOSIS — I10 Essential (primary) hypertension: Secondary | ICD-10-CM

## 2019-01-12 MED ORDER — AMLODIPINE BESYLATE 5 MG PO TABS
0 refills | Status: CP
Start: 2019-01-12 — End: ?

## 2019-02-06 DIAGNOSIS — I10 Essential (primary) hypertension: Secondary | ICD-10-CM

## 2019-02-06 DIAGNOSIS — Z79899 Other long term (current) drug therapy: Principal | ICD-10-CM

## 2019-02-06 MED ORDER — AMLODIPINE BESYLATE 5 MG PO TABS
ORAL | 1 refills | 90.00000 days | Status: CP
Start: 2019-02-06 — End: ?

## 2021-11-13 ENCOUNTER — Other Ambulatory Visit: Payer: Self-pay

## 2021-11-13 ENCOUNTER — Encounter (HOSPITAL_COMMUNITY): Payer: Self-pay

## 2021-11-13 ENCOUNTER — Emergency Department (HOSPITAL_COMMUNITY): Payer: Medicare Other

## 2021-11-13 ENCOUNTER — Emergency Department (HOSPITAL_COMMUNITY)
Admission: EM | Admit: 2021-11-13 | Discharge: 2021-11-13 | Disposition: A | Discharge status: Home / Self Care | Payer: Medicare Other | Attending: Emergency Medicine | Admitting: Emergency Medicine

## 2021-11-13 DIAGNOSIS — D649 Anemia, unspecified: Secondary | ICD-10-CM | POA: Diagnosis not present

## 2021-11-13 DIAGNOSIS — M542 Cervicalgia: Secondary | ICD-10-CM | POA: Diagnosis not present

## 2021-11-13 DIAGNOSIS — S0993XA Unspecified injury of face, initial encounter: Secondary | ICD-10-CM | POA: Diagnosis present

## 2021-11-13 DIAGNOSIS — R600 Localized edema: Secondary | ICD-10-CM | POA: Diagnosis not present

## 2021-11-13 DIAGNOSIS — Y9259 Other trade areas as the place of occurrence of the external cause: Secondary | ICD-10-CM | POA: Diagnosis not present

## 2021-11-13 DIAGNOSIS — S0083XA Contusion of other part of head, initial encounter: Secondary | ICD-10-CM

## 2021-11-13 DIAGNOSIS — M25511 Pain in right shoulder: Secondary | ICD-10-CM | POA: Diagnosis not present

## 2021-11-13 DIAGNOSIS — W01198A Fall on same level from slipping, tripping and stumbling with subsequent striking against other object, initial encounter: Secondary | ICD-10-CM | POA: Insufficient documentation

## 2021-11-13 DIAGNOSIS — Z23 Encounter for immunization: Secondary | ICD-10-CM | POA: Diagnosis not present

## 2021-11-13 DIAGNOSIS — I10 Essential (primary) hypertension: Secondary | ICD-10-CM | POA: Diagnosis not present

## 2021-11-13 DIAGNOSIS — W19XXXA Unspecified fall, initial encounter: Secondary | ICD-10-CM

## 2021-11-13 DIAGNOSIS — Z79899 Other long term (current) drug therapy: Secondary | ICD-10-CM | POA: Insufficient documentation

## 2021-11-13 DIAGNOSIS — R079 Chest pain, unspecified: Secondary | ICD-10-CM | POA: Diagnosis not present

## 2021-11-13 DIAGNOSIS — S01511A Laceration without foreign body of lip, initial encounter: Secondary | ICD-10-CM | POA: Diagnosis not present

## 2021-11-13 DIAGNOSIS — E119 Type 2 diabetes mellitus without complications: Secondary | ICD-10-CM | POA: Diagnosis not present

## 2021-11-13 DIAGNOSIS — R42 Dizziness and giddiness: Secondary | ICD-10-CM | POA: Insufficient documentation

## 2021-11-13 HISTORY — DX: Type 2 diabetes mellitus without complications: E11.9

## 2021-11-13 LAB — URINALYSIS, ROUTINE W REFLEX MICROSCOPIC
Bilirubin Urine: NEGATIVE
Glucose, UA: NEGATIVE mg/dL
Ketones, ur: NEGATIVE mg/dL
Nitrite: NEGATIVE
Protein, ur: 30 mg/dL — AB
Specific Gravity, Urine: 1.013 (ref 1.005–1.030)
WBC, UA: >50 WBC/hpf — ABNORMAL HIGH (ref 0–5)
pH: 7 (ref 5.0–8.0)

## 2021-11-13 LAB — CBC WITH DIFFERENTIAL/PLATELET
Abs Immature Granulocytes: 0.06 10*3/uL (ref 0.00–0.07)
Basophils Absolute: 0 10*3/uL (ref 0.0–0.1)
Basophils Relative: 0 %
Eosinophils Absolute: 0.1 10*3/uL (ref 0.0–0.5)
Eosinophils Relative: 1 %
HCT: 30.4 % — ABNORMAL LOW (ref 36.0–46.0)
Hemoglobin: 9.3 g/dL — ABNORMAL LOW (ref 12.0–15.0)
Immature Granulocytes: 0 %
Lymphocytes Relative: 16 %
Lymphs Abs: 2.2 10*3/uL (ref 0.7–4.0)
MCH: 29.3 pg (ref 26.0–34.0)
MCHC: 30.6 g/dL (ref 30.0–36.0)
MCV: 95.9 fL (ref 80.0–100.0)
Monocytes Absolute: 1.2 10*3/uL — ABNORMAL HIGH (ref 0.1–1.0)
Monocytes Relative: 9 %
Neutro Abs: 10.6 10*3/uL — ABNORMAL HIGH (ref 1.7–7.7)
Neutrophils Relative %: 74 %
Platelets: 289 10*3/uL (ref 150–400)
RBC: 3.17 MIL/uL — ABNORMAL LOW (ref 3.87–5.11)
RDW: 16.7 % — ABNORMAL HIGH (ref 11.5–15.5)
WBC: 14.1 10*3/uL — ABNORMAL HIGH (ref 4.0–10.5)
nRBC: 0 % (ref 0.0–0.2)

## 2021-11-13 LAB — COMPREHENSIVE METABOLIC PANEL
ALT: 26 U/L (ref 0–44)
AST: 46 U/L — ABNORMAL HIGH (ref 15–41)
Albumin: 3.7 g/dL (ref 3.5–5.0)
Alkaline Phosphatase: 133 U/L — ABNORMAL HIGH (ref 38–126)
Anion gap: 8 (ref 5–15)
BUN: 20 mg/dL (ref 8–23)
CO2: 25 mmol/L (ref 22–32)
Calcium: 9.6 mg/dL (ref 8.9–10.3)
Chloride: 111 mmol/L (ref 98–111)
Creatinine, Ser: 1.09 mg/dL — ABNORMAL HIGH (ref 0.44–1.00)
GFR, Estimated: 55 mL/min — ABNORMAL LOW (ref >60)
Glucose, Bld: 152 mg/dL — ABNORMAL HIGH (ref 70–99)
Potassium: 4.5 mmol/L (ref 3.5–5.1)
Sodium: 144 mmol/L (ref 135–145)
Total Bilirubin: 1 mg/dL (ref 0.3–1.2)
Total Protein: 7.8 g/dL (ref 6.5–8.1)

## 2021-11-13 LAB — CBG MONITORING, ED: Glucose-Capillary: 141 mg/dL — ABNORMAL HIGH (ref 70–99)

## 2021-11-13 LAB — ETHANOL: Alcohol, Ethyl (B): <10 mg/dL (ref <10)

## 2021-11-13 LAB — RAPID URINE DRUG SCREEN, HOSP PERFORMED
Amphetamines: NOT DETECTED
Barbiturates: NOT DETECTED
Benzodiazepines: NOT DETECTED
Cocaine: NOT DETECTED
Opiates: NOT DETECTED
Tetrahydrocannabinol: NOT DETECTED

## 2021-11-13 LAB — POC OCCULT BLOOD, ED: Fecal Occult Bld: NEGATIVE

## 2021-11-13 LAB — TROPONIN I (HIGH SENSITIVITY)
Troponin I (High Sensitivity): <2 ng/L (ref <18)
Troponin I (High Sensitivity): 2 ng/L (ref <18)

## 2021-11-13 LAB — BRAIN NATRIURETIC PEPTIDE: B Natriuretic Peptide: 129.4 pg/mL — ABNORMAL HIGH (ref 0.0–100.0)

## 2021-11-13 MED ORDER — LACTATED RINGERS IV BOLUS
500.0000 mL | Freq: Once | INTRAVENOUS | Status: AC
  Administered 2021-11-13: 500 mL via INTRAVENOUS

## 2021-11-13 MED ORDER — TETANUS-DIPHTH-ACELL PERTUSSIS 5-2.5-18.5 LF-MCG/0.5 IM SUSY
0.5000 mL | PREFILLED_SYRINGE | Freq: Once | INTRAMUSCULAR | Status: AC
  Administered 2021-11-13: 0.5 mL via INTRAMUSCULAR
  Filled 2021-11-13: qty 0.5

## 2021-11-13 MED ORDER — CHLORHEXIDINE GLUCONATE 0.12 % MT SOLN: 15.0000 mL | Freq: Two times a day (BID) | OROMUCOSAL | 0 refills | Status: AC

## 2021-11-13 MED ORDER — FERROUS SULFATE 325 (65 FE) MG PO TABS: 325.0000 mg | ORAL_TABLET | Freq: Every day | ORAL | 0 refills | Status: AC

## 2021-11-13 MED ORDER — MORPHINE SULFATE (PF) 4 MG/ML IV SOLN
4.0000 mg | Freq: Once | INTRAVENOUS | Status: AC
  Administered 2021-11-13: 4 mg via INTRAVENOUS
  Filled 2021-11-13: qty 1

## 2021-11-13 MED ORDER — LIDOCAINE-EPINEPHRINE (PF) 2 %-1:200000 IJ SOLN
10.0000 mL | Freq: Once | INTRAMUSCULAR | Status: AC
  Administered 2021-11-13: 10 mL via INTRADERMAL
  Filled 2021-11-13: qty 20

## 2021-11-13 NOTE — Discharge Instructions (Addendum)
Your hemoglobin today is 9.3.  You should start on iron and call your doctor on Monday to discuss what your baseline level is not to get a recheck.  The stitches in your mouth will dissolve on their own.  If you develop new or worsening headache, or if you develop chest pain, shortness of breath, dizziness, passing out, weakness, or any other new/concerning symptoms then return to the ER for evaluation.

## 2021-11-13 NOTE — ED Triage Notes (Signed)
Pt bib ems for multiple falls today.  Pt states she fell twice and hit her face both times.  Pt states facial pain, headache, neck pain, and right shoulder pain.  No active bleeding noted upon triage but swelling to eyes, mouth and face noted.  Pt placed in c-collar upon arrival to ed for c-spine precautions d/t pt c/o neck pain.

## 2021-11-13 NOTE — ED Provider Notes (Signed)
[facility] DEPT Provider Note   CSN: [id number] Arrival date & time: 11/13/21  1421     History  Chief Complaint  Patient presents with   Fall   Shoulder Pain   Facial Injury   Facial Pain    Debra David is a 71 y.o. female.  HPI 71 year old female with a history of diabetes and hypertension presents with fall and facial/head injury.  History is from patient but also the daughter at the bedside.  [place] and the patient was behind the daughter and all of a sudden fell.  The patient tells me that she got dizzy and fell forward.  She hit her face and head.  Did not seem to lose consciousness according to patient or daughter.  Was walking and about 50 feet later (per the daughter) fell again and this time injured herself more.  Due to this EMS was called.  Patient reports persistent dizziness/lightheadedness.  She has a headache, more on the right side, as well as a nose and facial injury.  She had transient nosebleed this gone.  She denies being on blood thinners.  She has a little bit of right shoulder pain and now has some chest pain since the second fall.  She had no preceding chest pain, headache, or syncope.  She did not have any focal weakness or numbness.  A little bit of left lateral neck pain and was put in a c-collar here by nursing staff. No recent illness.  On exam she is noted to have some swelling to both lower extremities.  She reports this has been ongoing for about 2 weeks.  Home Medications Prior to Admission medications   Medication Sig Start Date End Date Taking? Authorizing Provider  chlorhexidine (PERIDEX) 0.12 % solution Use as directed 15 mLs in the mouth or throat 2 (two) times daily. 11/13/21  Yes Sherwood Gambler, MD  ferrous sulfate 325 (65 FE) MG tablet Take 1 tablet (325 mg total) by mouth daily. 11/13/21  Yes Sherwood Gambler, MD  amLODipine (NORVASC) 5 MG tablet  06/26/15   [provider]   atorvastatin (LIPITOR) 20 MG tablet Take 1 tablet (20 mg total) by mouth daily. 07/21/15   Lance Bosch, NP  furosemide (LASIX) 20 MG tablet Take 1 tablet (20 mg total) by mouth daily. 07/21/15   Lance Bosch, NP  losartan (COZAAR) 50 MG tablet TAKE 1 TABLET BY MOUTH DAILY 11/24/15   Tresa Garter, MD  meloxicam (MOBIC) 7.5 MG tablet Take 1 tablet (7.5 mg total) by mouth daily. For knee/back pain 06/26/15   Lance Bosch, NP  metroNIDAZOLE (FLAGYL) 500 MG tablet Take 1 tablet (500 mg total) by mouth 2 (two) times daily. 07/21/15   Lance Bosch, NP  nystatin cream (MYCOSTATIN) Apply 1 application topically 2 (two) times daily. Apply to outside of the vaginal area 07/15/15   Lance Bosch, NP  ranitidine (ZANTAC) 150 MG tablet Take 1 tablet (150 mg total) by mouth 2 (two) times daily. 10/28/15   Brayton Caves, PA-C      Allergies    Patient has no known allergies.    Review of Systems   Review of Systems  Constitutional:  Negative for fever.  HENT:  Positive for facial swelling and nosebleeds.   Eyes:  Negative for pain and visual disturbance.  Respiratory:  Negative for shortness of breath.   Cardiovascular:  Positive for chest pain.  Gastrointestinal:  Negative  for abdominal pain.  Musculoskeletal:  Positive for arthralgias and neck pain.  Neurological:  Positive for light-headedness and headaches. Negative for syncope, weakness and numbness.    Physical Exam Updated Vital Signs BP (!) 170/86   Pulse 88   Temp 97.6 F (36.4 C) (Oral)   Resp (!) 21   Ht '5\' 5"'$  (1.651 m)   Wt 74.8 kg   SpO2 100%   BMI 27.46 kg/m  Physical Exam Vitals and nursing note reviewed.  Constitutional:      Appearance: She is well-developed.     Interventions: Cervical collar in place.  HENT:     Head: Normocephalic.      Mouth/Throat:     Mouth: Lacerations present.   Eyes:     Extraocular Movements: Extraocular movements intact.     Pupils: Pupils are equal, round, and reactive  to light.     Comments: Patient has left-sided subconjunctival hemorrhage but reports no pain and has normal extraocular movements bilaterally  Cardiovascular:     Rate and Rhythm: Normal rate and regular rhythm.     Pulses:          Radial pulses are 2+ on the right side.     Heart sounds: Normal heart sounds.  Pulmonary:     Effort: Pulmonary effort is normal.     Breath sounds: Normal breath sounds.  Abdominal:     General: There is no distension.     Palpations: Abdomen is soft.     Tenderness: There is no abdominal tenderness.  Musculoskeletal:     Right shoulder: Tenderness present. Normal range of motion.     Cervical back: No tenderness or bony tenderness. No spinous process tenderness.     Thoracic back: No bony tenderness.     Lumbar back: No tenderness or bony tenderness.       Back:     Right lower leg: Edema present.     Left lower leg: Edema present.     Comments: Mild tenderness to the medial right shoulder.  Normal range of motion.  She has a chronic left pinky deformity that is not new or worse. There is some mild pitting edema to both lower extremities/lower legs  Skin:    General: Skin is warm and dry.  Neurological:     Mental Status: She is alert.     Comments: Patient is alert and oriented to person, place, day of the week and month.  She persistently reports it is 2003 rather than 2023. CN 3-12 grossly intact. 5/5 strength in all 4 extremities. Grossly normal sensation.  She is able to walk.  She feels lightheaded but does not appear ataxic     ED Results / Procedures / Treatments   Labs (all labs ordered are listed, but only abnormal results are displayed) Labs Reviewed  COMPREHENSIVE METABOLIC PANEL - Abnormal; Notable for the following components:      Result Value   Glucose, Bld 152 (*)    Creatinine, Ser 1.09 (*)    AST 46 (*)    Alkaline Phosphatase 133 (*)    GFR, Estimated 55 (*)    All other components within normal limits  CBC WITH  DIFFERENTIAL/PLATELET - Abnormal; Notable for the following components:   WBC 14.1 (*)    RBC 3.17 (*)    Hemoglobin 9.3 (*)    HCT 30.4 (*)    RDW 16.7 (*)    Neutro Abs 10.6 (*)    Monocytes Absolute 1.2 (*)  All other components within normal limits  BRAIN NATRIURETIC PEPTIDE - Abnormal; Notable for the following components:   B Natriuretic Peptide 129.4 (*)    All other components within normal limits  URINALYSIS, ROUTINE W REFLEX MICROSCOPIC - Abnormal; Notable for the following components:   APPearance CLOUDY (*)    Hgb urine dipstick SMALL (*)    Protein, ur 30 (*)    Leukocytes,Ua LARGE (*)    WBC, UA >50 (*)    Bacteria, UA MANY (*)    Non Squamous Epithelial 6-10 (*)    All other components within normal limits  CBG MONITORING, ED - Abnormal; Notable for the following components:   Glucose-Capillary 141 (*)    All other components within normal limits  ETHANOL  RAPID URINE DRUG SCREEN, HOSP PERFORMED  POC OCCULT BLOOD, ED  TROPONIN I (HIGH SENSITIVITY)  TROPONIN I (HIGH SENSITIVITY)    EKG EKG Interpretation  Date/Time:  Friday November 13 2021 14:37:56 EDT Ventricular Rate:  92 PR Interval:  181 QRS Duration: 91 QT Interval:  387 QTC Calculation: 479 R Axis:   83 Text Interpretation: Sinus rhythm Borderline right axis deviation no acute ST/T changes ?PR depression Interpretation limited secondary to artifact No old tracing to compare Confirmed by Sherwood Gambler [phone]) on 11/13/2021 3:42:39 PM  Radiology CT Head Wo Contrast  Result Date: 11/13/2021 CLINICAL DATA:  Multiple falls today. Patient stated she fell twice and hit her face both times. EXAM: CT HEAD WITHOUT CONTRAST CT MAXILLOFACIAL WITHOUT CONTRAST CT CERVICAL SPINE WITHOUT CONTRAST TECHNIQUE: Multidetector CT imaging of the head, cervical spine, and maxillofacial structures were performed using the standard protocol without intravenous contrast. Multiplanar CT image reconstructions of the cervical spine  and maxillofacial structures were also generated. RADIATION DOSE REDUCTION: This exam was performed according to the departmental dose-optimization program which includes automated exposure control, adjustment of the mA and/or kV according to patient size and/or use of iterative reconstruction technique. COMPARISON:  CT examination dated FINDINGS: CT HEAD FINDINGS Brain: No evidence of acute infarction, hemorrhage, hydrocephalus, extra-axial collection or mass lesion/mass effect. Vascular: No hyperdense vessel or unexpected calcification. Skull: Hyperostosis frontalis interna. Negative for fracture or focal lesion. Other: None. CT MAXILLOFACIAL FINDINGS Osseous: No fracture or mandibular dislocation. No destructive process. Orbits: Negative. No traumatic or inflammatory finding. Sinuses: Mucosal thickening of ethmoid air cells. Remaining paranasal sinuses are clear. Soft tissues: Soft tissue swelling in the left maxillary region without evidence of drainable hematoma or fluid collection. CT CERVICAL SPINE FINDINGS Alignment: Straightening of the cervical spine. Skull base and vertebrae: No acute fracture. No primary bone lesion. 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 Soft tissues and spinal canal: No prevertebral fluid or swelling. No visible canal hematoma. Disc levels: C2-C3: Significant spinal canal or neural foraminal stenosis. Asymmetric right facet joint arthropathy. C3-C4: Uncovertebral joint arthropathy with moderate bilateral neural foraminal stenosis. Moderate facet joint arthropathy. C4-C5: Significant spinal canal or neural foraminal stenosis. Asymmetric right facet joint arthropathy. C5-C6: Prominent anterior bridging osteophytes. No significant spinal canal foraminal stenosis. C6-C7:  No significant spinal canal or neural foraminal stenosis. C7-T1:  No significant spinal canal or neural foraminal stenosis. Upper chest: Negative. Other: None IMPRESSION: 1.  No acute  intracranial abnormality. 2. Marked soft tissue swelling in the left maxillary region without evidence of drainable fluid collection or hematoma. No maxillofacial fracture. 3.  No cervical spine fracture or traumatic subluxation. 4. 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 5.  Multilevel degenerate disc  disease prominent at C3-C4, C4-C5. Electronically Signed   By: Keane Police D.O.   On: 11/13/2021 16:24   CT Cervical Spine Wo Contrast  Result Date: 11/13/2021 CLINICAL DATA:  Multiple falls today. Patient stated she fell twice and hit her face both times. EXAM: CT HEAD WITHOUT CONTRAST CT MAXILLOFACIAL WITHOUT CONTRAST CT CERVICAL SPINE WITHOUT CONTRAST TECHNIQUE: Multidetector CT imaging of the head, cervical spine, and maxillofacial structures were performed using the standard protocol without intravenous contrast. Multiplanar CT image reconstructions of the cervical spine and maxillofacial structures were also generated. RADIATION DOSE REDUCTION: This exam was performed according to the departmental dose-optimization program which includes automated exposure control, adjustment of the mA and/or kV according to patient size and/or use of iterative reconstruction technique. COMPARISON:  CT examination dated FINDINGS: CT HEAD FINDINGS Brain: No evidence of acute infarction, hemorrhage, hydrocephalus, extra-axial collection or mass lesion/mass effect. Vascular: No hyperdense vessel or unexpected calcification. Skull: Hyperostosis frontalis interna. Negative for fracture or focal lesion. Other: None. CT MAXILLOFACIAL FINDINGS Osseous: No fracture or mandibular dislocation. No destructive process. Orbits: Negative. No traumatic or inflammatory finding. Sinuses: Mucosal thickening of ethmoid air cells. Remaining paranasal sinuses are clear. Soft tissues: Soft tissue swelling in the left maxillary region without evidence of drainable hematoma or fluid collection. CT CERVICAL SPINE FINDINGS  Alignment: Straightening of the cervical spine. Skull base and vertebrae: No acute fracture. No primary bone lesion. 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 Soft tissues and spinal canal: No prevertebral fluid or swelling. No visible canal hematoma. Disc levels: C2-C3: Significant spinal canal or neural foraminal stenosis. Asymmetric right facet joint arthropathy. C3-C4: Uncovertebral joint arthropathy with moderate bilateral neural foraminal stenosis. Moderate facet joint arthropathy. C4-C5: Significant spinal canal or neural foraminal stenosis. Asymmetric right facet joint arthropathy. C5-C6: Prominent anterior bridging osteophytes. No significant spinal canal foraminal stenosis. C6-C7:  No significant spinal canal or neural foraminal stenosis. C7-T1:  No significant spinal canal or neural foraminal stenosis. Upper chest: Negative. Other: None IMPRESSION: 1.  No acute intracranial abnormality. 2. Marked soft tissue swelling in the left maxillary region without evidence of drainable fluid collection or hematoma. No maxillofacial fracture. 3.  No cervical spine fracture or traumatic subluxation. 4. 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 5.  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 Electronically Signed   By: Keane Police D.O.   On: 11/13/2021 16:24   CT Maxillofacial Wo Contrast  Result Date: 11/13/2021 CLINICAL DATA:  Multiple falls today. Patient stated she fell twice and hit her face both times. EXAM: CT HEAD WITHOUT CONTRAST CT MAXILLOFACIAL WITHOUT CONTRAST CT CERVICAL SPINE WITHOUT CONTRAST TECHNIQUE: Multidetector CT imaging of the head, cervical spine, and maxillofacial structures were performed using the standard protocol without intravenous contrast. Multiplanar CT image reconstructions of the cervical spine and maxillofacial structures were also generated. RADIATION DOSE REDUCTION: This exam was performed according to the  departmental dose-optimization program which includes automated exposure control, adjustment of the mA and/or kV according to patient size and/or use of iterative reconstruction technique. COMPARISON:  CT examination dated FINDINGS: CT HEAD FINDINGS Brain: No evidence of acute infarction, hemorrhage, hydrocephalus, extra-axial collection or mass lesion/mass effect. Vascular: No hyperdense vessel or unexpected calcification. Skull: Hyperostosis frontalis interna. Negative for fracture or focal lesion. Other: None. CT MAXILLOFACIAL FINDINGS Osseous: No fracture or mandibular dislocation. No destructive process. Orbits: Negative. No traumatic or inflammatory finding. Sinuses: Mucosal thickening of ethmoid air cells. Remaining paranasal sinuses are clear. Soft  tissues: Soft tissue swelling in the left maxillary region without evidence of drainable hematoma or fluid collection. CT CERVICAL SPINE FINDINGS Alignment: Straightening of the cervical spine. Skull base and vertebrae: No acute fracture. No primary bone lesion. 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 Soft tissues and spinal canal: No prevertebral fluid or swelling. No visible canal hematoma. Disc levels: C2-C3: Significant spinal canal or neural foraminal stenosis. Asymmetric right facet joint arthropathy. C3-C4: Uncovertebral joint arthropathy with moderate bilateral neural foraminal stenosis. Moderate facet joint arthropathy. C4-C5: Significant spinal canal or neural foraminal stenosis. Asymmetric right facet joint arthropathy. C5-C6: Prominent anterior bridging osteophytes. No significant spinal canal foraminal stenosis. C6-C7:  No significant spinal canal or neural foraminal stenosis. C7-T1:  No significant spinal canal or neural foraminal stenosis. Upper chest: Negative. Other: None IMPRESSION: 1.  No acute intracranial abnormality. 2. Marked soft tissue swelling in the left maxillary region without evidence of drainable fluid  collection or hematoma. No maxillofacial fracture. 3.  No cervical spine fracture or traumatic subluxation. 4. 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 5.  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 Electronically Signed   By: Keane Police D.O.   On: 11/13/2021 16:24   DG Shoulder Right  Result Date: 11/13/2021 CLINICAL DATA:  chest pain, fall EXAM: RIGHT SHOULDER - 2+ VIEW COMPARISON:  None Available. FINDINGS: There is no evidence of fracture or dislocation. There is no evidence of arthropathy or other focal bone abnormality. Soft tissues are unremarkable. IMPRESSION: Negative. Electronically Signed   By: Iven Finn M.D.   On: 11/13/2021 15:56   DG Chest 2 View  Result Date: 11/13/2021 CLINICAL DATA:  Chest pain after a fall. EXAM: CHEST - 2 VIEW COMPARISON:  None Available. FINDINGS: Asymmetric elevation left hemidiaphragm with gaseous distension of bowel loops in the left upper quadrant. Streaky opacity at the lung bases likely related to atelectasis. No substantial pleural effusion. No evidence for pneumothorax. Interstitial markings are diffusely coarsened with chronic features. Cardiopericardial silhouette is at upper limits of normal for size. Telemetry leads overlie the chest. IMPRESSION: 1. Asymmetric elevation left hemidiaphragm with bibasilar atelectasis. 2. Chronic interstitial lung disease. No pulmonary edema or pleural effusion. 3. Gaseous distension of bowel in the left upper quadrant. Electronically Signed   By: Misty Stanley M.D.   On: 11/13/2021 15:52    Procedures .Marland KitchenLaceration Repair  Date/Time: 11/13/2021 5:36 PM  Performed by: Sherwood Gambler, MD Authorized by: Sherwood Gambler, MD   Consent:    Consent obtained:  Verbal   Consent given by:  Patient Universal protocol:    Patient identity confirmed:  Verbally with patient Anesthesia:    Anesthesia method:  Local infiltration   Local anesthetic:  Lidocaine 2% WITH  epi Laceration details:    Location:  Lip   Lip location:  Lower interior lip   Length (cm):  3 Pre-procedure details:    Preparation:  Patient was prepped and draped in usual sterile fashion Treatment:    Area cleansed with:  Saline   Amount of cleaning:  Standard   Irrigation solution:  Sterile saline   Irrigation method:  Syringe Skin repair:    Repair method:  Sutures   Suture size:  5-0   Wound skin closure material used: vicryl.   Suture technique:  Simple interrupted   Number of sutures:  4 Approximation:    Approximation:  Close Repair type:    Repair type:  Simple Post-procedure details:    Dressing:  Open (no dressing)   Procedure completion:  Tolerated well, no immediate complications .Marland KitchenLaceration Repair  Date/Time: 11/13/2021 5:38 PM  Performed by: Sherwood Gambler, MD Authorized by: Sherwood Gambler, MD   Consent:    Consent obtained:  Verbal   Consent given by:  Patient Universal protocol:    Patient identity confirmed:  Verbally with patient Laceration details:    Location:  Lip   Lip location:  Lower interior lip   Length (cm):  2 Treatment:    Area cleansed with:  Saline   Amount of cleaning:  Standard   Irrigation solution:  Sterile saline   Irrigation method:  Syringe Skin repair:    Repair method:  Sutures   Suture size:  5-0   Wound skin closure material used: vicryl.   Suture technique:  Simple interrupted   Number of sutures:  3 Approximation:    Approximation:  Close Repair type:    Repair type:  Simple Post-procedure details:    Procedure completion:  Tolerated well, no immediate complications     Medications Ordered in ED Medications  lidocaine-EPINEPHrine (XYLOCAINE W/EPI) 2 %-1:200000 (PF) injection 10 mL (10 mLs Intradermal Given by Other 11/13/21 1811)  morphine (PF) 4 MG/ML injection 4 mg (4 mg Intravenous Given 11/13/21 1640)  Tdap (BOOSTRIX) injection 0.5 mL (0.5 mLs Intramuscular Given 11/13/21 1641)  lactated ringers bolus 500  mL (0 mLs Intravenous Stopped 11/13/21 1812)    ED Course/ Medical Decision Making/ A&P                           Medical Decision Making Amount and/or Complexity of Data Reviewed Independent Historian:     Details: daughter External Data Reviewed: notes. Labs: ordered. Radiology: ordered and independent interpretation performed. ECG/medicine tests: ordered and independent interpretation performed.  Risk OTC drugs. Prescription drug management.   Patient CT scanning is fortunately unremarkable.  I personally viewed/interpreted these images and she has no head bleed, spine fracture, or facial fracture.  She does have inner lip/mucosal lacerations that were repaired.  Tdap was updated as she is unsure when the last one was.  She was given 1 dose of morphine.  She was given a small bolus of fluids.  She now feels like the dizziness is gone.  She is ambulatory and has no ataxia.  My suspicion for a CNS emergency, especially posterior circulation is pretty low.  EKG does not show any obvious emergent findings.  Troponins are negative x2 and is sound like that chest pain was either a result of the fall or after the fall.  I think PE, ACS, dissection is pretty low.  She is noted to have a hemoglobin of 9.3 which is new for [place] in our charts but we have not seen her in about 10 years.  She typically follows in [place] and what ever provider she sees is not available in epic.  She is not sure what her normal hemoglobin is.  I did do an occult blood test but she denies any GI symptoms and this is negative.  She and daughter prefer for her to go home which I do not think is unreasonable, especially with improvement in symptoms.  However we did discuss strict return precautions and low threshold to return.  At this point, she is feeling well and feels ready for discharge.        Final Clinical Impression(s) / ED Diagnoses Final diagnoses:  Fall, initial encounter  Lip laceration,  initial encounter   Contusion of face, initial encounter  Anemia, unspecified type    Rx / DC Orders ED Discharge Orders          Ordered    chlorhexidine (PERIDEX) 0.12 % solution  2 times daily        11/13/21 1948    ferrous sulfate 325 (65 FE) MG tablet  Daily        11/13/21 1949              Sherwood Gambler, MD 11/13/21 2006

## 2023-06-30 IMAGING — CT CT MAXILLOFACIAL W/O CM
3 series · 14 of 47 positions shown, 16 images · non-contrast
Comparison: CT examination dated

CLINICAL DATA: Multiple falls today. Patient stated she fell twice
and hit her face both times.



[Series 3: max soft · axial · 0.34mm/px · z∈[-275,-111]mm · 8 of 96 slices shown, 10 images]
[im 7/96  brain]
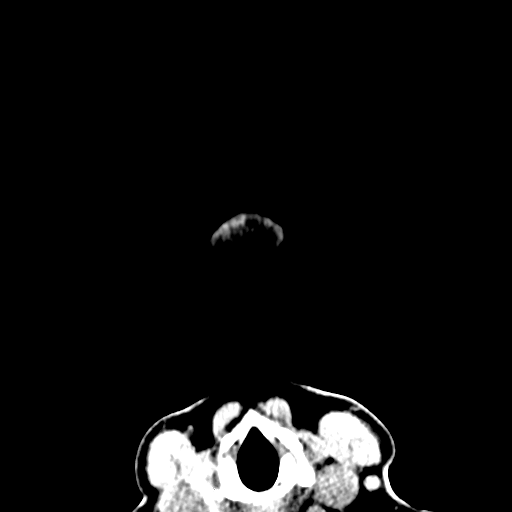
[im 7/96  bone]
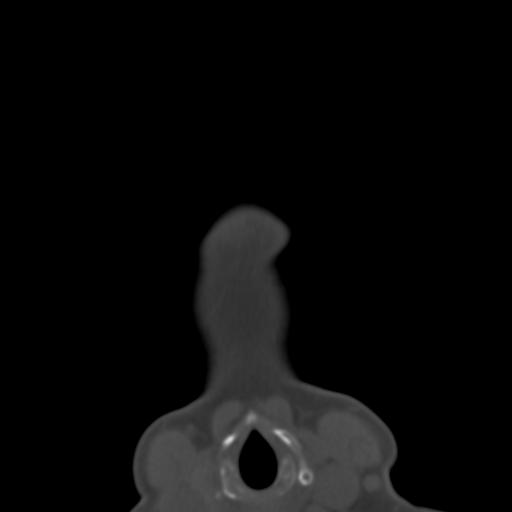
[im 20/96  bone]
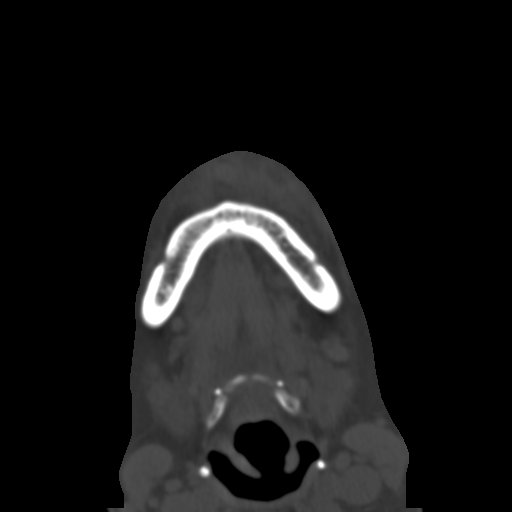
[im 30/96  bone]
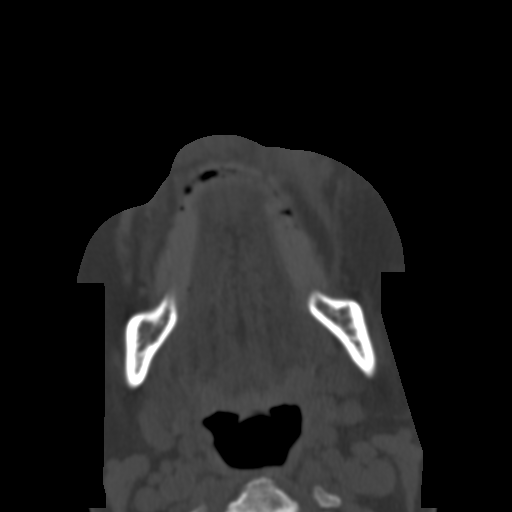
[im 43/96  bone]
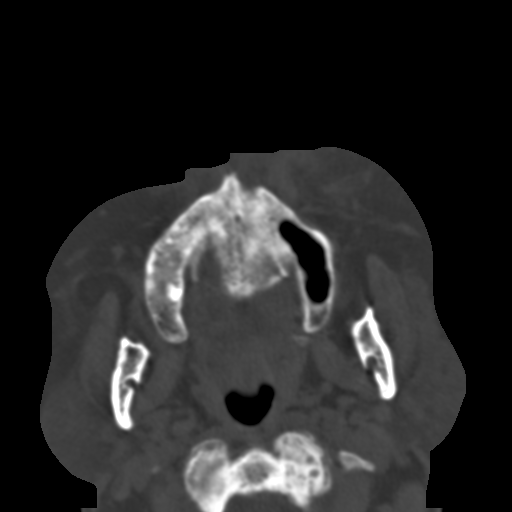
[im 53/96  brain]
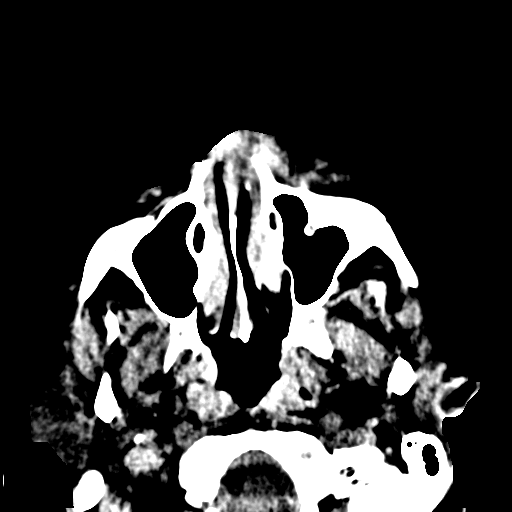
[im 53/96  bone]
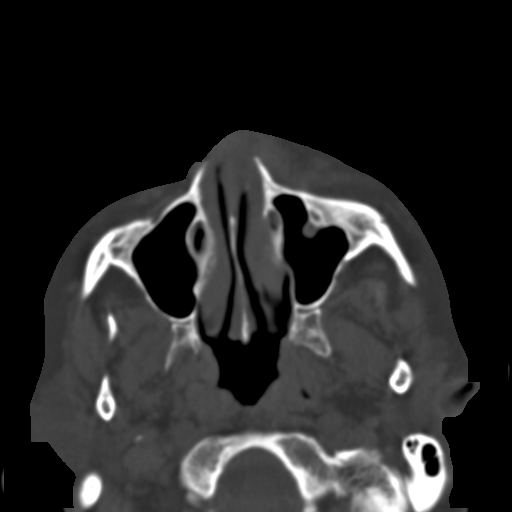
[im 66/96  bone]
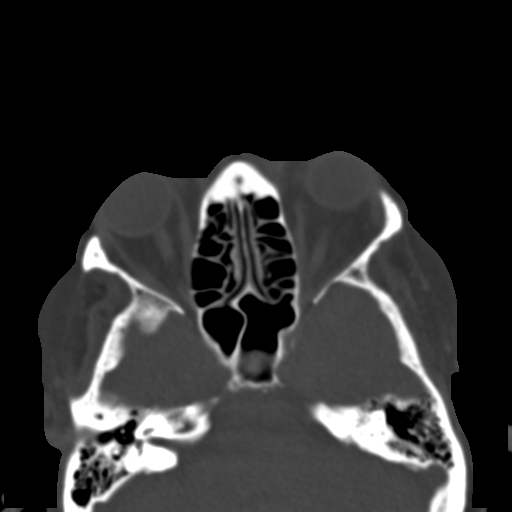
[im 76/96  bone]
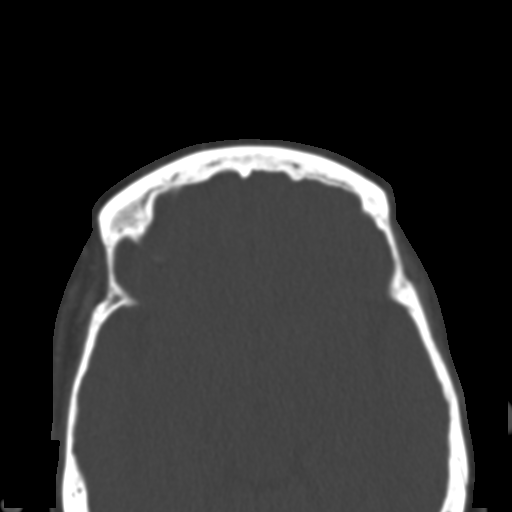
[im 89/96  bone]
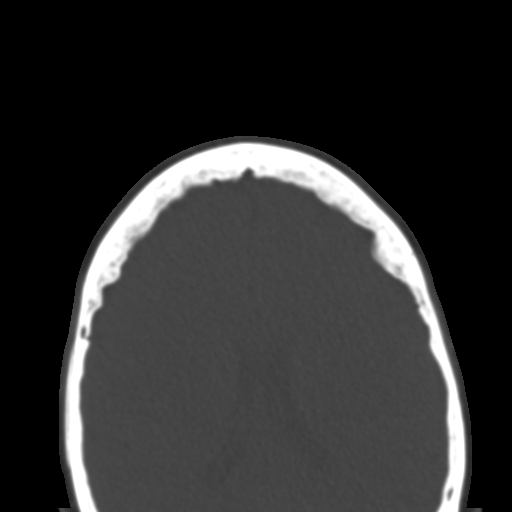

[Series 7: coronal soft · coronal · 0.33mm/px · 3 of 75 slices shown]
[im 25/75  bone]
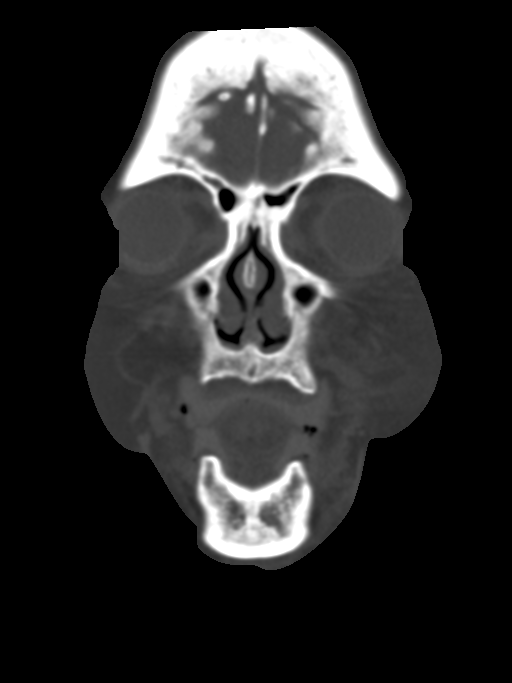
[im 33/75  bone]
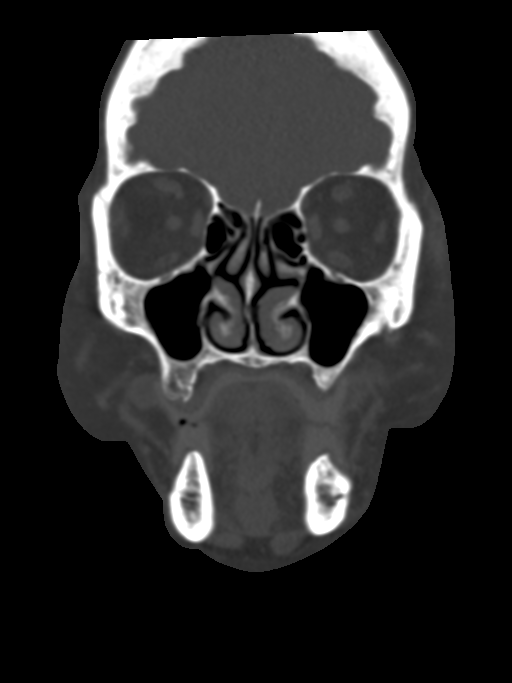
[im 42/75  bone]
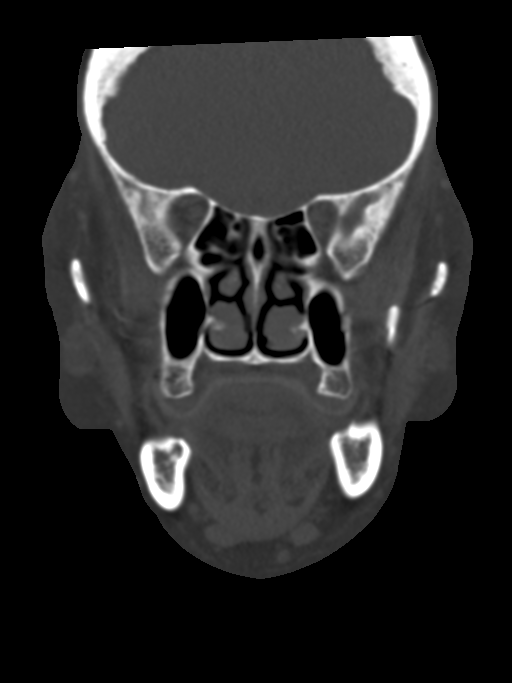

[Series 8: sagittal soft · sagittal · 0.29mm/px · 3 of 85 slices shown]
[im 29/85  bone]
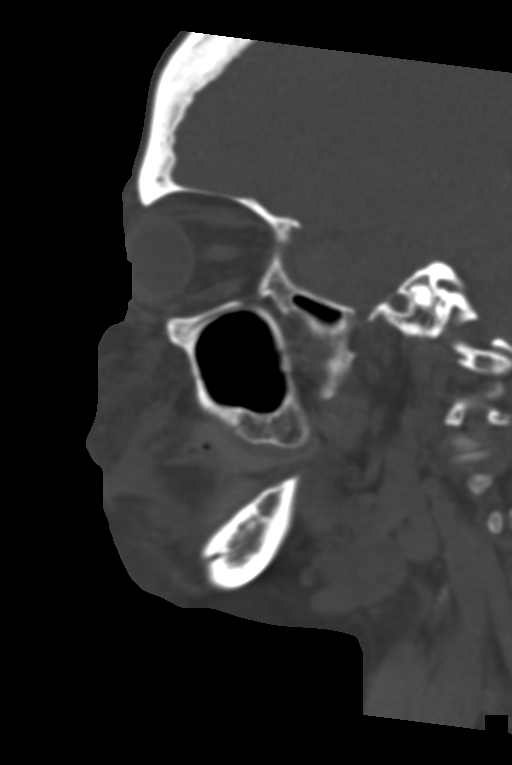
[im 43/85  bone]
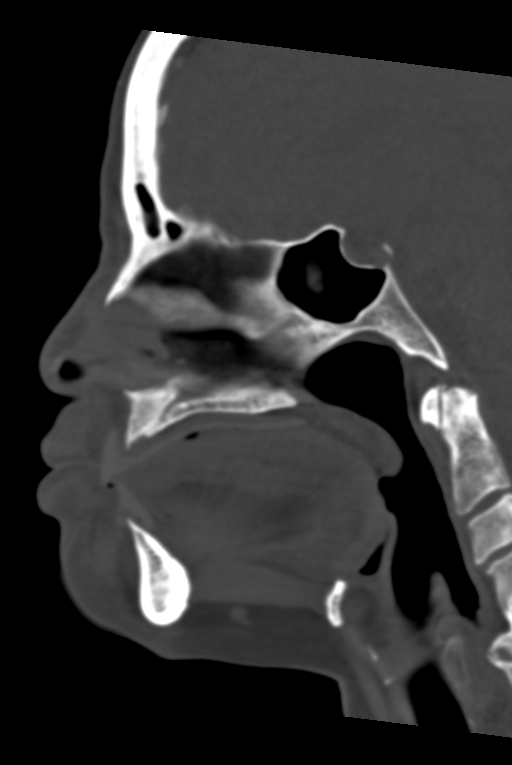
[im 57/85  bone]
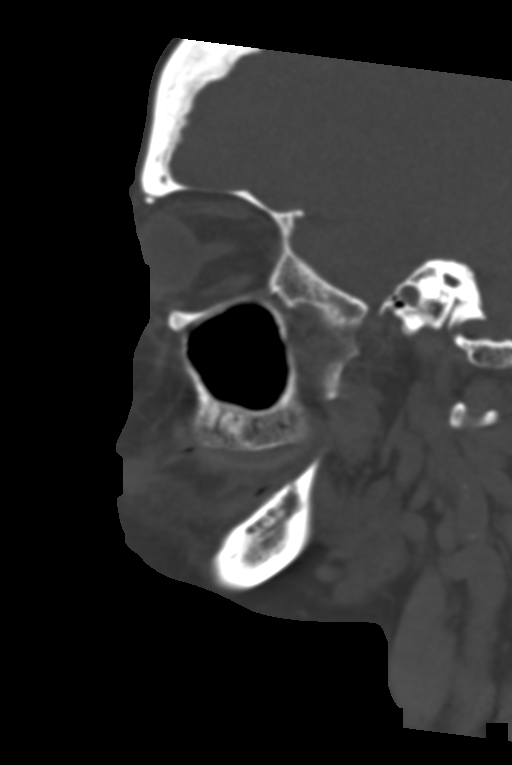

[14 of 47 positions shown; findings below may reference images not displayed]

FINDINGS: CT HEAD FINDINGS

Brain: No evidence of acute infarction, hemorrhage, hydrocephalus,
extra-axial collection or mass lesion/mass effect.

Vascular: No hyperdense vessel or unexpected calcification.

Skull: Hyperostosis frontalis interna. Negative for fracture or
focal lesion.

Other: None.

CT MAXILLOFACIAL FINDINGS

Osseous: No fracture or mandibular dislocation. No destructive
process.

Orbits: Negative. No traumatic or inflammatory finding.

Sinuses: Mucosal thickening of ethmoid air cells. Remaining
paranasal sinuses are clear.

Soft tissues: Soft tissue swelling in the left maxillary region
without evidence of drainable hematoma or fluid collection.

CT CERVICAL SPINE FINDINGS

Alignment: Straightening of the cervical spine.

Skull base and vertebrae: No acute fracture. No primary bone lesion.
Asymmetric arthritic changes of the left lateral atlantoaxial joint.

Soft tissues and spinal canal: No prevertebral fluid or swelling. No
visible canal hematoma.

Disc levels:

C2-C3: Significant spinal canal or neural foraminal stenosis.
Asymmetric right facet joint arthropathy.

C3-C4: Uncovertebral joint arthropathy with moderate bilateral
neural foraminal stenosis. Moderate facet joint arthropathy.

C4-C5: Significant spinal canal or neural foraminal stenosis.
Asymmetric right facet joint arthropathy.

C5-C6: Prominent anterior bridging osteophytes. No significant
spinal canal foraminal stenosis.

C6-C7:  No significant spinal canal or neural foraminal stenosis.

C7-T1:  No significant spinal canal or neural foraminal stenosis.

Upper chest: Negative.

Other: None
IMPRESSION: 1.  No acute intracranial abnormality.

2. Marked soft tissue swelling in the left maxillary region without
evidence of drainable fluid collection or hematoma. No maxillofacial
fracture.

3.  No cervical spine fracture or traumatic subluxation.

4. Asymmetric arthritic changes of the left lateral atlantoaxial
joint.

5.  Multilevel degenerate disc disease prominent at C3-C4, C4-C5.
